# Patient Record
Sex: Female | Born: 1980 | Race: White | Hispanic: No | Smoking: Current every day smoker
Health system: Southern US, Community
[De-identification: ages and names within clinical notes are randomized; demographics above are authoritative.]

## PROBLEM LIST (undated history)

## (undated) DIAGNOSIS — F909 Attention-deficit hyperactivity disorder, unspecified type: Secondary | ICD-10-CM

---

## 2001-03-20 ENCOUNTER — Other Ambulatory Visit: Admission: RE | Admit: 2001-03-20 | Discharge: 2001-03-20 | Payer: Self-pay

## 2004-07-31 ENCOUNTER — Emergency Department (HOSPITAL_COMMUNITY): Admission: EM | Admit: 2004-07-31 | Discharge: 2004-07-31 | Payer: Self-pay | Admitting: Emergency Medicine

## 2005-08-25 ENCOUNTER — Emergency Department (HOSPITAL_COMMUNITY): Admission: EM | Admit: 2005-08-25 | Discharge: 2005-08-25 | Payer: Self-pay | Admitting: Emergency Medicine

## 2005-10-18 ENCOUNTER — Emergency Department (HOSPITAL_COMMUNITY): Admission: EM | Admit: 2005-10-18 | Discharge: 2005-10-18 | Payer: Self-pay | Admitting: Emergency Medicine

## 2005-10-22 ENCOUNTER — Emergency Department (HOSPITAL_COMMUNITY): Admission: EM | Admit: 2005-10-22 | Discharge: 2005-10-22 | Payer: Self-pay | Admitting: Emergency Medicine

## 2005-12-08 ENCOUNTER — Emergency Department (HOSPITAL_COMMUNITY): Admission: EM | Admit: 2005-12-08 | Discharge: 2005-12-08 | Payer: Self-pay | Admitting: Emergency Medicine

## 2006-03-19 ENCOUNTER — Emergency Department (HOSPITAL_COMMUNITY): Admission: EM | Admit: 2006-03-19 | Discharge: 2006-03-19 | Payer: Self-pay | Admitting: Emergency Medicine

## 2006-05-23 ENCOUNTER — Emergency Department (HOSPITAL_COMMUNITY): Admission: EM | Admit: 2006-05-23 | Discharge: 2006-05-23 | Payer: Self-pay | Admitting: Emergency Medicine

## 2006-05-27 ENCOUNTER — Emergency Department (HOSPITAL_COMMUNITY): Admission: EM | Admit: 2006-05-27 | Discharge: 2006-05-27 | Payer: Self-pay | Admitting: Emergency Medicine

## 2006-09-14 ENCOUNTER — Emergency Department (HOSPITAL_COMMUNITY): Admission: EM | Admit: 2006-09-14 | Discharge: 2006-09-14 | Payer: Self-pay | Admitting: Emergency Medicine

## 2006-10-24 ENCOUNTER — Emergency Department (HOSPITAL_COMMUNITY): Admission: EM | Admit: 2006-10-24 | Discharge: 2006-10-24 | Payer: Self-pay | Admitting: Emergency Medicine

## 2006-11-14 ENCOUNTER — Emergency Department (HOSPITAL_COMMUNITY): Admission: EM | Admit: 2006-11-14 | Discharge: 2006-11-14 | Payer: Self-pay | Admitting: *Deleted

## 2006-12-21 ENCOUNTER — Emergency Department (HOSPITAL_COMMUNITY): Admission: EM | Admit: 2006-12-21 | Discharge: 2006-12-21 | Payer: Self-pay | Admitting: Emergency Medicine

## 2006-12-29 ENCOUNTER — Emergency Department (HOSPITAL_COMMUNITY): Admission: EM | Admit: 2006-12-29 | Discharge: 2006-12-30 | Payer: Self-pay | Admitting: Emergency Medicine

## 2007-03-22 ENCOUNTER — Emergency Department (HOSPITAL_COMMUNITY): Admission: EM | Admit: 2007-03-22 | Discharge: 2007-03-22 | Payer: Self-pay | Admitting: Emergency Medicine

## 2007-06-04 ENCOUNTER — Emergency Department (HOSPITAL_COMMUNITY): Admission: EM | Admit: 2007-06-04 | Discharge: 2007-06-04 | Payer: Self-pay | Admitting: Emergency Medicine

## 2007-06-04 ENCOUNTER — Emergency Department (HOSPITAL_COMMUNITY): Admission: EM | Admit: 2007-06-04 | Discharge: 2007-06-05 | Payer: Self-pay | Admitting: Emergency Medicine

## 2007-06-05 ENCOUNTER — Ambulatory Visit (HOSPITAL_COMMUNITY): Admission: RE | Admit: 2007-06-05 | Discharge: 2007-06-05 | Payer: Self-pay | Admitting: Emergency Medicine

## 2007-08-05 ENCOUNTER — Emergency Department (HOSPITAL_COMMUNITY): Admission: EM | Admit: 2007-08-05 | Discharge: 2007-08-05 | Payer: Self-pay | Admitting: Emergency Medicine

## 2007-08-14 ENCOUNTER — Emergency Department (HOSPITAL_COMMUNITY): Admission: EM | Admit: 2007-08-14 | Discharge: 2007-08-14 | Payer: Self-pay | Admitting: Emergency Medicine

## 2007-09-05 ENCOUNTER — Emergency Department (HOSPITAL_COMMUNITY): Admission: EM | Admit: 2007-09-05 | Discharge: 2007-09-05 | Payer: Self-pay | Admitting: Emergency Medicine

## 2008-01-23 ENCOUNTER — Emergency Department (HOSPITAL_COMMUNITY): Admission: EM | Admit: 2008-01-23 | Discharge: 2008-01-23 | Payer: Self-pay | Admitting: Emergency Medicine

## 2008-02-14 ENCOUNTER — Emergency Department (HOSPITAL_COMMUNITY): Admission: EM | Admit: 2008-02-14 | Discharge: 2008-02-14 | Payer: Self-pay | Admitting: Emergency Medicine

## 2008-03-11 ENCOUNTER — Emergency Department (HOSPITAL_COMMUNITY): Admission: EM | Admit: 2008-03-11 | Discharge: 2008-03-11 | Payer: Self-pay | Admitting: Emergency Medicine

## 2008-04-01 ENCOUNTER — Emergency Department (HOSPITAL_COMMUNITY): Admission: EM | Admit: 2008-04-01 | Discharge: 2008-04-01 | Payer: Self-pay | Admitting: Emergency Medicine

## 2008-04-05 ENCOUNTER — Emergency Department (HOSPITAL_COMMUNITY): Admission: EM | Admit: 2008-04-05 | Discharge: 2008-04-05 | Payer: Self-pay | Admitting: Emergency Medicine

## 2008-05-06 ENCOUNTER — Emergency Department (HOSPITAL_COMMUNITY): Admission: EM | Admit: 2008-05-06 | Discharge: 2008-05-06 | Payer: Self-pay | Admitting: Emergency Medicine

## 2010-12-31 LAB — BASIC METABOLIC PANEL
Calcium: 9.7
Chloride: 105
Creatinine, Ser: 0.91
GFR calc Af Amer: >60

## 2010-12-31 LAB — URINE MICROSCOPIC-ADD ON

## 2010-12-31 LAB — DIFFERENTIAL
Basophils Absolute: 0
Basophils Relative: 0
Monocytes Absolute: 0.6
Monocytes Relative: 6
Neutro Abs: 5.9
Neutrophils Relative %: 58

## 2010-12-31 LAB — CBC
RBC: 4.37
WBC: 10.1

## 2010-12-31 LAB — GC/CHLAMYDIA PROBE AMP, GENITAL
Chlamydia, DNA Probe: NEGATIVE
GC Probe Amp, Genital: NEGATIVE

## 2010-12-31 LAB — URINALYSIS, ROUTINE W REFLEX MICROSCOPIC
Glucose, UA: NEGATIVE
Ketones, ur: 15 — AB
Leukocytes, UA: NEGATIVE

## 2010-12-31 LAB — HEPATIC FUNCTION PANEL
AST: 22
Albumin: 4
Bilirubin, Direct: 0.1

## 2010-12-31 LAB — WET PREP, GENITAL

## 2010-12-31 LAB — RPR: RPR Ser Ql: NONREACTIVE

## 2011-01-14 LAB — STREP A DNA PROBE: Group A Strep Probe: NEGATIVE

## 2011-01-14 LAB — RAPID STREP SCREEN (MED CTR MEBANE ONLY): Streptococcus, Group A Screen (Direct): NEGATIVE

## 2011-01-17 LAB — URINALYSIS, ROUTINE W REFLEX MICROSCOPIC
Nitrite: NEGATIVE
Protein, ur: NEGATIVE
Urobilinogen, UA: 1
pH: 5.5

## 2011-01-17 LAB — PREGNANCY, URINE: Preg Test, Ur: NEGATIVE

## 2011-01-21 LAB — CULTURE, ROUTINE-ABSCESS

## 2012-11-26 DIAGNOSIS — R079 Chest pain, unspecified: Secondary | ICD-10-CM

## 2013-02-08 ENCOUNTER — Encounter (HOSPITAL_COMMUNITY): Payer: Self-pay | Admitting: Emergency Medicine

## 2013-02-08 ENCOUNTER — Emergency Department (HOSPITAL_COMMUNITY): Payer: Medicaid Other

## 2013-02-08 ENCOUNTER — Emergency Department (HOSPITAL_COMMUNITY)
Admission: EM | Admit: 2013-02-08 | Discharge: 2013-02-08 | Disposition: A | Discharge status: Home / Self Care | Payer: Medicaid Other | Attending: Emergency Medicine | Admitting: Emergency Medicine

## 2013-02-08 DIAGNOSIS — M25561 Pain in right knee: Secondary | ICD-10-CM

## 2013-02-08 DIAGNOSIS — M25569 Pain in unspecified knee: Secondary | ICD-10-CM | POA: Insufficient documentation

## 2013-02-08 DIAGNOSIS — F17200 Nicotine dependence, unspecified, uncomplicated: Secondary | ICD-10-CM | POA: Insufficient documentation

## 2013-02-08 MED ORDER — HYDROCODONE-ACETAMINOPHEN 5-325 MG PO TABS
1.0000 | ORAL_TABLET | Freq: Once | ORAL | Status: AC
  Administered 2013-02-08: 1 via ORAL
  Filled 2013-02-08: qty 1

## 2013-02-08 MED ORDER — MELOXICAM 7.5 MG PO TABS: 7.5000 mg | ORAL_TABLET | Freq: Every day | ORAL | Status: DC

## 2013-02-08 NOTE — ED Provider Notes (Signed)
CSN: [id number]     Arrival date & time 02/08/13  1153 History   First MD Initiated Contact with Patient 02/08/13 1201     Chief Complaint  Patient presents with  . Knee Pain   (Consider location/radiation/quality/duration/timing/severity/associated sxs/prior Treatment) Patient is a 32 y.o. female presenting with knee pain. The history is provided by the patient.  Knee Pain Location:  Knee Time since incident:  5 days Injury: no   Knee location:  R knee Pain details:    Quality:  Shooting and throbbing   Radiates to:  Does not radiate   Severity:  Moderate Associated symptoms: no fever    Jennifer Ewing is a 32 y.o. female who presents to the ED with right knee pain. She states that 5 days ago she had pain in her right foot but it went away. Last night she started having pain in the right knee and it has continued. She has not taken anything for pain. She is wearing a knee brace. She denies any other problems today.   History reviewed. No pertinent past medical history. History reviewed. No pertinent past surgical history. History reviewed. No pertinent family history. History  Substance Use Topics  . Smoking status: Current Every Day Smoker -- 0.50 packs/day  . Smokeless tobacco: Not on file  . Alcohol Use: No   OB History   Grav Para Term Preterm Abortions TAB SAB Ect Mult Living                 Review of Systems  Constitutional: Negative for fever and chills.  HENT: Negative.   Eyes: Negative for visual disturbance.  Respiratory: Negative for shortness of breath.   Cardiovascular: Negative for chest pain.  Gastrointestinal: Negative for nausea, vomiting and abdominal pain.  Musculoskeletal:       Right knee pain   Skin: Negative for wound.  Allergic/Immunologic: Negative for immunocompromised state.  Neurological: Negative for headaches.  Psychiatric/Behavioral: Negative for confusion. The patient is not nervous/anxious.     Allergies  Review of patient's  allergies indicates no known allergies.  Home Medications  No current outpatient prescriptions on file. BP 121/73  Pulse 55  Temp(Src) 97.7 F (36.5 C) (Oral)  Ht 5\' 1"  (1.549 m)  Wt 135 lb (61.236 kg)  BMI 25.52 kg/m2  SpO2 99%  LMP 01/25/2013 Physical Exam  Nursing note and vitals reviewed. Constitutional: She is oriented to person, place, and time. She appears well-developed and well-nourished.  Eyes: Conjunctivae and EOM are normal.  Neck: Neck supple.  Cardiovascular: Normal rate.   Pulmonary/Chest: Effort normal.  Musculoskeletal:       Right knee: She exhibits normal range of motion, no ecchymosis, no deformity, no laceration, no erythema and no LCL laxity. Swelling: minimal. Tenderness found. MCL tenderness noted.  Neurological: She is alert and oriented to person, place, and time. No cranial nerve deficit.  Skin: Skin is warm and dry.  Psychiatric: She has a normal mood and affect. Her behavior is normal.    ED Course  Procedures Dg Knee Complete 4 Views Right  02/08/2013   CLINICAL DATA:  Right knee pain, no known injury  EXAM: RIGHT KNEE - COMPLETE 4+ VIEW  COMPARISON:  None.  FINDINGS: No fracture or dislocation is seen.  The joint spaces are preserved.  The visualized soft tissues are unremarkable.  No definite suprapatellar knee joint effusion.  IMPRESSION: No fracture or dislocation is seen.   Electronically Signed   By: Roselie Awkward.D.  On: 02/08/2013 12:51    MDM  32 y.o. female with right knee pain. Placed in knee immobilizer, crutches with instructions, ice and elevate. Patient to follow up with ortho. Patient stable for discharge without any immediate complications. She remains neurovascularly intact.  Discussed with the patient clinical and lab findings and all questioned fully answered.   Medication List         meloxicam 7.5 MG tablet  Commonly known as:  MOBIC  Take 1 tablet (7.5 mg total) by mouth daily.            Janne Napoleon,  [place] 02/08/13 [phone]

## 2013-02-08 NOTE — ED Notes (Signed)
Pain to right foot 5 days ago and now pain is in right knee .  No injury

## 2013-02-09 NOTE — ED Provider Notes (Signed)
History/physical exam/procedure(s) were performed by non-physician practitioner and as supervising physician I was immediately available for consultation/collaboration. I have reviewed all notes and am in agreement with care and plan.   Hilario Quarry, MD 02/09/13 [phone]

## 2014-01-20 ENCOUNTER — Emergency Department (HOSPITAL_COMMUNITY): Payer: Medicaid Other

## 2014-01-20 ENCOUNTER — Emergency Department (HOSPITAL_COMMUNITY)
Admission: EM | Admit: 2014-01-20 | Discharge: 2014-01-20 | Disposition: A | Discharge status: Home / Self Care | Payer: Medicaid Other | Attending: Emergency Medicine | Admitting: Emergency Medicine

## 2014-01-20 ENCOUNTER — Encounter (HOSPITAL_COMMUNITY): Payer: Self-pay | Admitting: Emergency Medicine

## 2014-01-20 DIAGNOSIS — Z79899 Other long term (current) drug therapy: Secondary | ICD-10-CM | POA: Diagnosis not present

## 2014-01-20 DIAGNOSIS — M79642 Pain in left hand: Secondary | ICD-10-CM

## 2014-01-20 DIAGNOSIS — Z791 Long term (current) use of non-steroidal anti-inflammatories (NSAID): Secondary | ICD-10-CM | POA: Insufficient documentation

## 2014-01-20 DIAGNOSIS — M25512 Pain in left shoulder: Secondary | ICD-10-CM

## 2014-01-20 DIAGNOSIS — Y9289 Other specified places as the place of occurrence of the external cause: Secondary | ICD-10-CM | POA: Insufficient documentation

## 2014-01-20 DIAGNOSIS — W11XXXA Fall on and from ladder, initial encounter: Secondary | ICD-10-CM | POA: Diagnosis not present

## 2014-01-20 DIAGNOSIS — Y9389 Activity, other specified: Secondary | ICD-10-CM | POA: Insufficient documentation

## 2014-01-20 DIAGNOSIS — Z720 Tobacco use: Secondary | ICD-10-CM | POA: Diagnosis not present

## 2014-01-20 DIAGNOSIS — S4992XA Unspecified injury of left shoulder and upper arm, initial encounter: Secondary | ICD-10-CM | POA: Insufficient documentation

## 2014-01-20 MED ORDER — HYDROCODONE-ACETAMINOPHEN 5-325 MG PO TABS: 1.0000 | ORAL_TABLET | ORAL | Status: DC | PRN

## 2014-01-20 MED ORDER — IBUPROFEN 600 MG PO TABS: 600.0000 mg | ORAL_TABLET | Freq: Four times a day (QID) | ORAL | Status: DC | PRN

## 2014-01-20 MED ORDER — HYDROCODONE-ACETAMINOPHEN 5-325 MG PO TABS
1.0000 | ORAL_TABLET | Freq: Once | ORAL | Status: AC
  Administered 2014-01-20: 1 via ORAL
  Filled 2014-01-20: qty 1

## 2014-01-20 NOTE — ED Notes (Signed)
Patient states she fell 6 ft off a ladder yesterday morning and is complaining of left arm pain from shoulder to hand.

## 2014-01-20 NOTE — Discharge Instructions (Signed)
Shoulder Pain The shoulder is the joint that connects your arms to your body. The bones that form the shoulder joint include the upper arm bone (humerus), the shoulder blade (scapula), and the collarbone (clavicle). The top of the humerus is shaped like a ball and fits into a rather flat socket on the scapula (glenoid cavity). A combination of muscles and strong, fibrous tissues that connect muscles to bones (tendons) support your shoulder joint and hold the ball in the socket. Small, fluid-filled sacs (bursae) are located in different areas of the joint. They act as cushions between the bones and the overlying soft tissues and help reduce friction between the gliding tendons and the bone as you move your arm. Your shoulder joint allows a wide range of motion in your arm. This range of motion allows you to do things like scratch your back or throw a ball. However, this range of motion also makes your shoulder more prone to pain from overuse and injury. Causes of shoulder pain can originate from both injury and overuse and usually can be grouped in the following four categories:  Redness, swelling, and pain (inflammation) of the tendon (tendinitis) or the bursae (bursitis).  Instability, such as a dislocation of the joint.  Inflammation of the joint (arthritis).  Broken bone (fracture). HOME CARE INSTRUCTIONS   Apply ice to the sore area.  Put ice in a plastic bag.  Place a towel between your skin and the bag.  Leave the ice on for 15-20 minutes, 3-4 times per day for the first 2 days, or as directed by your health care provider.  Stop using cold packs if they do not help with the pain.  If you have a shoulder sling or immobilizer, wear it as long as your caregiver instructs. Only remove it to shower or bathe. Move your arm as little as possible, but keep your hand moving to prevent swelling.  Squeeze a soft ball or foam pad as much as possible to help prevent swelling.  Only take  over-the-counter or prescription medicines for pain, discomfort, or fever as directed by your caregiver. SEEK MEDICAL CARE IF:   Your shoulder pain increases, or new pain develops in your arm, hand, or fingers.  Your hand or fingers become cold and numb.  Your pain is not relieved with medicines. SEEK IMMEDIATE MEDICAL CARE IF:   Your arm, hand, or fingers are numb or tingling.  Your arm, hand, or fingers are significantly swollen or turn white or blue. MAKE SURE YOU:   Understand these instructions.  Will watch your condition.  Will get help right away if you are not doing well or get worse. Document Released: 01/05/2005 Document Revised: 08/12/2013 Document Reviewed: 03/12/2011 [facility]Exit[facility] Patient Information 2015 [place], [place]. This information is not intended to replace advice given to you by your health care provider. Make sure you discuss any questions you have with your health care provider.   Your x-rays are negative today for any bony injury.  Use the sling for comfort, continue using ice packs to your shoulder and wrist.  He may use the medication prescribed for pain relief, use caution with hydrocodone as this will make you drowsy.  Do not drive within 4 hours of taking this medication.  Please call Dr. Hilda LiasKeeling for further evaluation of your injuries if your pain persists beyond the next week.  You may need further tests to rule out any soft tissue injury such as ligament or tendon strain.

## 2014-01-20 NOTE — ED Provider Notes (Signed)
CSN: [id number]     Arrival date & time 01/20/14  1042 History  This chart was scribed for non-physician practitioner Burgess AmorJulie Hoang Pettingill, PA-C working with Glynn OctaveStephen Rancour, MD by Littie Deedsichard Sun, ED Scribe. This patient was seen in room APFT20/APFT20 and the patient's care was started at 12:11 PM.    Chief Complaint  Patient presents with  . Arm Pain      The history is provided by the patient. No language interpreter was used.   HPI Comments: Jennifer BoyerJamie M Ewing is a 33 y.o. female who presents to the Emergency Department complaining of sudden onset, constant left shoulder and left hand pain after she fell off of a ladder on her left shoulder onto mulch around 7:00 am yesterday. Patient notes feeling a grinding feeling in her left shoulder with movement. Patient has tried taking Tylenol for her pain but with no relief to her pain. Her pain is constant, aching and worsened with movement and palpation.  She denies weakness or numbness.  She has an appointment with her PCP in 3 days for a normal checkup. Patient has been to an orthopedist for a hand injury in the past, but she does not remember who it was. She has allergies to codeine, but she has taken hydrocodone before and has done fine with it.   History reviewed. No pertinent past medical history. History reviewed. No pertinent past surgical history. History reviewed. No pertinent family history. History  Substance Use Topics  . Smoking status: Current Every Day Smoker -- 0.50 packs/day  . Smokeless tobacco: Not on file  . Alcohol Use: No   OB History   Grav Para Term Preterm Abortions TAB SAB Ect Mult Living                 Review of Systems  Constitutional: Negative for fever.  Musculoskeletal: Positive for arthralgias. Negative for joint swelling and myalgias.  Neurological: Negative for weakness and numbness.      Allergies  Codeine  Home Medications   Prior to Admission medications   Medication Sig Start Date End Date  Taking? Authorizing Provider  ALPRAZolam Prudy Feeler(XANAX) 0.5 MG tablet Take 0.5 mg by mouth 3 (three) times daily.   Yes Historical Provider, MD  Brexpiprazole (REXULTI) 3 MG TABS Take 1 tablet by mouth at bedtime.   Yes Historical Provider, MD  ibuprofen (ADVIL,MOTRIN) 200 MG tablet Take 200-400 mg by mouth every 6 (six) hours as needed for moderate pain.   Yes Historical Provider, MD  lamoTRIgine (LAMICTAL) 25 MG tablet Take 25 mg by mouth 2 (two) times daily.   Yes Historical Provider, MD  naproxen sodium (ANAPROX) 220 MG tablet Take 440 mg by mouth 2 (two) times daily as needed (pain).   Yes Historical Provider, MD  HYDROcodone-acetaminophen (NORCO/VICODIN) 5-325 MG per tablet Take 1 tablet by mouth every 4 (four) hours as needed. 01/20/14   [name], PA-C  ibuprofen (ADVIL,MOTRIN) 600 MG tablet Take 1 tablet (600 mg total) by mouth every 6 (six) hours as needed. 01/20/14   [name], PA-C   BP 112/72  Pulse 89  Temp(Src) 98.6 F (37 C) (Oral)  Resp 14  Ht 5\' 2"  (1.575 m)  Wt 130 lb (58.968 kg)  BMI 23.77 kg/m2  SpO2 99%  LMP 12/28/2013 Physical Exam  Nursing note and vitals reviewed. Constitutional: She appears well-developed and well-nourished.  HENT:  Head: Atraumatic.  Neck: Normal range of motion.  Cardiovascular:  Pulses equal bilaterally  Musculoskeletal: She exhibits tenderness.  TTP  left lateral humeral head, there is no visible trauma including no bruising or hematoma. She has increased pain with abduction. Non-tender remainder of her arm and wrist, but she is point tender over her 2nd MCP joint. No edema or bruising. She can wiggle her fingers and she has less than 2 seconds cap refill. Radial pulses are intact. No palpable or visible deformity.  Neurological: She is alert. She has normal strength. She displays normal reflexes. No sensory deficit.  Skin: Skin is warm and dry.  Psychiatric: She has a normal mood and affect.    ED Course  Procedures DIAGNOSTIC  STUDIES: Oxygen Saturation is 99% on RA, nml by my interpretation.    COORDINATION OF CARE: 12:17 PM-Discussed treatment plan which includes medication and possible referral to an orthopedic specialist with pt at bedside and pt agreed to plan.   Labs Review Labs Reviewed - No data to display  Imaging Review Dg Shoulder Left  01/20/2014   CLINICAL DATA:  LEFT shoulder and hand pain for 1 day, fell off a ladder.  EXAM: LEFT SHOULDER - 2+ VIEW  COMPARISON:  None.  FINDINGS: There is no evidence of fracture or dislocation. There is no evidence of arthropathy or other focal bone abnormality. Soft tissues are unremarkable.  IMPRESSION: Negative.   Electronically Signed   By: Davonna BellingJohn  Curnes M.D.   On: 01/20/2014 12:00   Dg Hand Complete Left  01/20/2014   CLINICAL DATA:  Pain in the arm and hand. Duration column 1 day. Fall from a ladder.  EXAM: LEFT HAND - COMPLETE 3+ VIEW  COMPARISON:  None.  FINDINGS: There is no evidence of fracture or dislocation. There is no evidence of arthropathy or other focal bone abnormality. Soft tissues are unremarkable.  IMPRESSION: Negative.   Electronically Signed   By: Herbie BaltimoreWalt  Liebkemann M.D.   On: 01/20/2014 11:59     EKG Interpretation None      MDM   Final diagnoses:  Shoulder pain, acute, left  Hand pain, left    Patients labs and/or radiological studies were viewed and considered during the medical decision making and disposition process. xrays negative, exam without obvious findings except for pain.  She was placed in a sling for comfort. Advised ice,  Rest, prescribed hydrocodone and ibuprofen for pain and inflammation.  Referral to Dr.Keeling for further evaluation if sx persist or are not improving over the next week  I personally performed the services described in this documentation, which was scribed in my presence. The recorded information has been reviewed and is accurate.   Burgess AmorJulie [name], PA-C 01/20/14 2056

## 2014-01-21 NOTE — ED Provider Notes (Signed)
Medical screening examination/treatment/procedure(s) were performed by non-physician practitioner and as supervising physician I was immediately available for consultation/collaboration.   EKG Interpretation None        Dequante Tremaine, MD 01/21/14 0710 

## 2014-02-24 ENCOUNTER — Encounter (HOSPITAL_COMMUNITY): Payer: Self-pay | Admitting: *Deleted

## 2014-02-24 ENCOUNTER — Emergency Department (HOSPITAL_COMMUNITY)
Admission: EM | Admit: 2014-02-24 | Discharge: 2014-02-24 | Disposition: A | Discharge status: Home / Self Care | Payer: Medicaid Other | Attending: Emergency Medicine | Admitting: Emergency Medicine

## 2014-02-24 DIAGNOSIS — Y9389 Activity, other specified: Secondary | ICD-10-CM | POA: Diagnosis not present

## 2014-02-24 DIAGNOSIS — X58XXXA Exposure to other specified factors, initial encounter: Secondary | ICD-10-CM | POA: Diagnosis not present

## 2014-02-24 DIAGNOSIS — S29002A Unspecified injury of muscle and tendon of back wall of thorax, initial encounter: Secondary | ICD-10-CM | POA: Diagnosis not present

## 2014-02-24 DIAGNOSIS — S4991XA Unspecified injury of right shoulder and upper arm, initial encounter: Secondary | ICD-10-CM | POA: Diagnosis present

## 2014-02-24 DIAGNOSIS — Y9289 Other specified places as the place of occurrence of the external cause: Secondary | ICD-10-CM | POA: Diagnosis not present

## 2014-02-24 DIAGNOSIS — Z79899 Other long term (current) drug therapy: Secondary | ICD-10-CM | POA: Diagnosis not present

## 2014-02-24 DIAGNOSIS — Y998 Other external cause status: Secondary | ICD-10-CM | POA: Insufficient documentation

## 2014-02-24 DIAGNOSIS — Z720 Tobacco use: Secondary | ICD-10-CM | POA: Diagnosis not present

## 2014-02-24 DIAGNOSIS — S46911A Strain of unspecified muscle, fascia and tendon at shoulder and upper arm level, right arm, initial encounter: Secondary | ICD-10-CM | POA: Insufficient documentation

## 2014-02-24 DIAGNOSIS — T148XXA Other injury of unspecified body region, initial encounter: Secondary | ICD-10-CM

## 2014-02-24 MED ORDER — TRAMADOL HCL 50 MG PO TABS: 50.0000 mg | ORAL_TABLET | Freq: Four times a day (QID) | ORAL | Status: DC | PRN

## 2014-02-24 MED ORDER — CYCLOBENZAPRINE HCL 5 MG PO TABS: 5.0000 mg | ORAL_TABLET | Freq: Three times a day (TID) | ORAL | Status: DC | PRN

## 2014-02-24 NOTE — Discharge Instructions (Signed)
Repetitive Strain Injuries  Repetitive strain injuries (RSIs) result from overuse or misuse of soft tissues including muscles, tendons, or nerves. Tendons are the cord-like structures that attach muscles to bones. RSIs can affect almost any part of the body. However, RSIs are most common in the arms (thumbs, wrists, elbows, shoulders) and legs (ankles, knees). Common medical conditions that are often caused by repetitive strain include carpal tunnel syndrome, tennis or golfer's elbow, bursitis, and tendonitis. If RSIs are treated early, and therepeated activity is reduced or removed, the severity and length of your problems can usually be reduced. RSIs are also called cumulative trauma disorders (CTD).   CAUSES   Many RSIs occur due to repeating the same activity at work over weeks or months without sufficient rest, such as prolonged typing. RSIs also commonly occur when a hobby or sport is done repeatedly without sufficient rest. RSIs can also occur due to repeated strain or stress on a body part in someone who has one or more risk factors for RSIs.  RISK FACTORS  Workplace risk factors   Frequent computer use, especially if your workstation is not adjusted for your body type.   Infrequent rest breaks.   Working in a high-pressure environment.   Working at a fast pace.   Repeating the same motion, such as frequent typing.   Working in an awkward position or holding the same position for a long time.   Forceful movements such as lifting, pulling, or pushing.   Vibration caused by using power tools.   Working in cold temperatures.   Job stress.  Personal risk factors   Poor posture.   Being loose-jointed.   Not exercising regularly.   Being overweight.   Arthritis, diabetes, thyroid problems, or other long-term (chronic)medical conditions.   Vitamin deficiencies.   Keeping your fingernails long.   An unhealthy, stressful, or inactive lifestyle.   Not sleeping well.  SYMPTOMS   Symptoms often  begin at work but become more noticeable after the repeated stress has ended. For example, you may develop fatigue or soreness in your wrist while typingat work, and at night you may develop numbness and tingling in your fingers. Common symptoms include:    Burning, shooting, or aching pain, especially in the fingers, palms, wrists, forearms, or shoulders.   Tenderness.   Swelling.   Tingling, numbness, or loss of feeling.   Pain with certain activities, such as turning a doorknob or reaching above your head.   Weakness, heaviness, or loss of coordination in yourhand.   Muscle spasms or tightness.  In some cases, symptoms can become so intense that it is difficult to perform everyday tasks. Symptoms that do not improve with rest may indicate a more serious condition.   DIAGNOSIS   Your caregiver may determine the type ofRSI you have based on your medical evaluation and a description of your activities.   TREATMENT   Treatment depends on the severity and type of RSI you have. Your caregiver may recommend rest for the affected body part, medicines, and physical or occupational therapy to reduce pain, swelling, and soreness. Discuss the activities you do repeatedly with your caregiver. Your caregiver can help you decide whether you need to change your activities. An RSI may take months or years to heal, especially if the affected body part gets insufficient rest. In some cases, such as severe carpal tunnel syndrome, surgery may be recommended.  PREVENTION   Talk with your supervisor to make sure you have the proper equipment   for your work station.   Maintain good posture at your desk or work station with:   Feet flat on the floor.   Knees directly over the feet, bent at a right angle.   Lower back supported by your chair or a cushion in the curve of your lower back.   Shoulders and arms relaxed and at your sides.   Neck relaxed and not bent forwards or backwards.   Your desk and computer workstation  properly adjusted to your body type.   Your chair adjusted so there is no excess pressure on the back of your thighs.   The keyboard resting above your thighs. You should be able to reach the keys with your elbows at your side, bent at a right angle. Your arms should be supported on forearm rests, with your forearms parallel to the ground.   The computer mouse within easy reach.   The monitor directly in front of you, so that your eyes are aligned with the top of the screen. The screen should be about 15 to 25 inches from your eyes.   While typing, keep your wrist straight, in a neutral position. Move your entire arm when you move your mouse or when typing hard-to-reach keys.   Only use your computer as much as you need to for work. Do not use it during breaks.   Take breaks often from any repeated activity. Alternate with another task which requires you to use different muscles, or rest at least once every hour.   Change positions regularly. If you spend a lot of time sitting, get up, walk around, and stretch.   Do not hold pens or pencils tightly when writing.   Exercise regularly.   Maintain a normal weight.   Eat a diet with plenty of vegetables, whole grains, and fruit.   Get sufficient, restful sleep.  HOME CARE INSTRUCTIONS   If your caregiver prescribed medicine to help reduce swelling, take it as directed.   Only take over-the-counter or prescription medicines for pain, discomfort, or fever as directed by your caregiver.   Reduce, and if needed, stopthe activities that are causing your problems until you have no further symptoms.If your symptoms are work-related, you may need to talk to your supervisor about changing your activities.   When symptoms develop, put ice or a cold pack on the aching area.   Put ice in a plastic bag.   Place a towel between your skin and the bag.   Leave the ice on for 15-20 minutes.   If you were given a splint to keep your wrist from bending, wear it as  instructed. It is important to wear the splint at night. Use the splint for as long as your caregiver recommends.  SEEK MEDICAL CARE IF:   You develop new problems.   Your problems do not get better with medicine.  MAKE SURE YOU:   Understand these instructions.   Will watch your condition.   Will get help right away if you are not doing well or get worse.  Document Released: 03/18/2002 Document Revised: 09/27/2011 Document Reviewed: 05/19/2011  Exit[employer] Patient Information 2015 [employer]. This information is not intended to replace advice given to you by your health care provider. Make sure you discuss any questions you have with your health care provider.

## 2014-02-24 NOTE — ED Provider Notes (Signed)
CSN: [id number]     Arrival date & time 02/24/14  1122 History  This chart was scribed for non-physician practitioner, Burgess AmorJulie Divine Hansley, PA-C,working with Geoffery Lyonsouglas Delo, MD, by Karle PlumberJennifer Tensley, ED Scribe. This patient was seen in room APFT21/APFT21 and the patient's care was started at 12:15 PM.  Chief Complaint  Patient presents with  . Shoulder Pain   Patient is a 33 y.o. female presenting with shoulder pain. The history is provided by the patient. No language interpreter was used.  Shoulder Pain Associated symptoms: no fever     HPI Comments:  Jennifer Ewing is a 33 y.o. 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 She states working and moving her neck makes her pain worse. Elevation of the shoulder also makes the pain worse. She states she has been taking Ibuprofen 600 mg with no relief of the pain. She has also applied a heating pad and taken Vicodin with significant relief of the pain. She has been using an OTC muscle rub as well. She states she was seen by her PCP and was told to get a referral from one of the hospitals for orthopedics. Denies numbness, weakness, or tingling of the right arm or hand, fever or chills. Pt is right-hand dominant. Denies trauma, injury or fall. Pt is currently menstruating.  PCP is Dr. Bradly BienenstockHassani  History reviewed. No pertinent past medical history. History reviewed. No pertinent past surgical history. No family history on file. History  Substance Use Topics  . Smoking status: Current Every Day Smoker -- 0.50 packs/day  . Smokeless tobacco: Not on file  . Alcohol Use: No   OB History    No data available     Review of Systems  Constitutional: Negative for fever.  Musculoskeletal: Positive for myalgias.  Skin: Negative for wound.  Neurological: Negative for weakness and numbness.    Allergies   Codeine  Home Medications   Prior to Admission medications   Medication Sig Start Date End Date Taking? Authorizing Provider  ALPRAZolam Prudy Feeler(XANAX) 0.5 MG tablet Take 0.5 mg by mouth 3 (three) times daily.    Historical Provider, MD  Brexpiprazole (REXULTI) 3 MG TABS Take 1 tablet by mouth at bedtime.    Historical Provider, MD  cyclobenzaprine (FLEXERIL) 5 MG tablet Take 1 tablet (5 mg total) by mouth 3 (three) times daily as needed for muscle spasms. 02/24/14   Burgess AmorJulie Alliyah Roesler, PA-C  HYDROcodone-acetaminophen (NORCO/VICODIN) 5-325 MG per tablet Take 1 tablet by mouth every 4 (four) hours as needed. 01/20/14   Burgess AmorJulie Lonnell Chaput, PA-C  ibuprofen (ADVIL,MOTRIN) 200 MG tablet Take 200-400 mg by mouth every 6 (six) hours as needed for moderate pain.    Historical Provider, MD  ibuprofen (ADVIL,MOTRIN) 600 MG tablet Take 1 tablet (600 mg total) by mouth every 6 (six) hours as needed. 01/20/14   Burgess AmorJulie Thereasa Iannello, PA-C  lamoTRIgine (LAMICTAL) 25 MG tablet Take 25 mg by mouth 2 (two) times daily.    Historical Provider, MD  naproxen sodium (ANAPROX) 220 MG tablet Take 440 mg by mouth 2 (two) times daily as needed (pain).    Historical Provider, MD  traMADol (ULTRAM) 50 MG tablet Take 1 tablet (50 mg total) by mouth every 6 (six) hours as needed. 02/24/14   Burgess AmorJulie Andranik Jeune, PA-C   BP 112/49 mmHg  Pulse 81  Temp(Src) 98.5 F (36.9 C) (Oral)  Resp 16  Ht 5\' 2"  (1.575 m)  Wt 130 lb (58.968 kg)  BMI 23.77 kg/m2  SpO2 94%  LMP 02/21/2014 Physical Exam  Constitutional: She appears well-developed and well-nourished.  HENT:  Head: Atraumatic.  Neck: Normal range of motion.  Cardiovascular:  Pulses equal bilaterally  Musculoskeletal: She exhibits tenderness.  Generalized tenderness across right upper shoulder and upper back consistent with distribution of trapezius muscle. Full ROM of cervical spine. No midline tenderness to palpation. Not tender to palpation over right shoulder.  Neurological: She is alert. She has  normal strength. She displays normal reflexes. No sensory deficit.  Equal grip strength.  Skin: Skin is warm and dry.  Psychiatric: She has a normal mood and affect.    ED Course  Procedures (including critical care time) DIAGNOSTIC STUDIES: Oxygen Saturation is 94% on RA, adequate by my interpretation.   COORDINATION OF CARE: 12:22 PM- Will prescribe pain medication, muscle relaxer, nausea medication and refer to orthopedics. Pt verbalizes understanding and agrees to plan.  Medications - No data to display  Labs Review Labs Reviewed - No data to display  Imaging Review No results found.   EKG Interpretation None      MDM   Final diagnoses:  Muscle strain    Pt prescribed flexeril, tramadol. Encouraged to continue with ibuprofen and heat tx. F/u with pcp and or ortho if sx are not better with this tx.    The patient appears reasonably screened and/or stabilized for discharge and I doubt any other medical condition or other [facility] requiring further screening, evaluation, or treatment in the ED at this time prior to discharge.   I personally performed the services described in this documentation, which was scribed in my presence. The recorded information has been reviewed and is accurate.    Burgess AmorJulie Lilias Lorensen, PA-C 02/26/14 2257  Geoffery Lyonsouglas Delo, MD 02/28/14 2112

## 2014-02-24 NOTE — ED Notes (Signed)
Pt states she is having right shoulder pain starting 3 weeks ago when she started at her new job.  Worse with movement. Denies injury.

## 2014-02-24 NOTE — ED Notes (Signed)
Pt alert, NAd, family with her. Pt ready for d/c when seen by me

## 2014-09-18 ENCOUNTER — Emergency Department (HOSPITAL_COMMUNITY)
Admission: EM | Admit: 2014-09-18 | Discharge: 2014-09-18 | Disposition: A | Discharge status: Home / Self Care | Payer: Medicaid Other | Attending: Emergency Medicine | Admitting: Emergency Medicine

## 2014-09-18 ENCOUNTER — Encounter (HOSPITAL_COMMUNITY): Payer: Self-pay | Admitting: Cardiology

## 2014-09-18 ENCOUNTER — Emergency Department (HOSPITAL_COMMUNITY): Payer: Medicaid Other

## 2014-09-18 DIAGNOSIS — J069 Acute upper respiratory infection, unspecified: Secondary | ICD-10-CM | POA: Diagnosis not present

## 2014-09-18 DIAGNOSIS — R062 Wheezing: Secondary | ICD-10-CM | POA: Diagnosis not present

## 2014-09-18 DIAGNOSIS — Z3202 Encounter for pregnancy test, result negative: Secondary | ICD-10-CM | POA: Diagnosis not present

## 2014-09-18 DIAGNOSIS — Z720 Tobacco use: Secondary | ICD-10-CM | POA: Diagnosis not present

## 2014-09-18 DIAGNOSIS — R112 Nausea with vomiting, unspecified: Secondary | ICD-10-CM | POA: Diagnosis present

## 2014-09-18 DIAGNOSIS — Z79899 Other long term (current) drug therapy: Secondary | ICD-10-CM | POA: Insufficient documentation

## 2014-09-18 DIAGNOSIS — R197 Diarrhea, unspecified: Secondary | ICD-10-CM | POA: Insufficient documentation

## 2014-09-18 DIAGNOSIS — R079 Chest pain, unspecified: Secondary | ICD-10-CM | POA: Insufficient documentation

## 2014-09-18 DIAGNOSIS — R059 Cough, unspecified: Secondary | ICD-10-CM

## 2014-09-18 DIAGNOSIS — R05 Cough: Secondary | ICD-10-CM

## 2014-09-18 LAB — COMPREHENSIVE METABOLIC PANEL
ALBUMIN: 4.2 g/dL (ref 3.5–5.0)
ALK PHOS: 56 U/L (ref 38–126)
ALT: 11 U/L — ABNORMAL LOW (ref 14–54)
AST: 17 U/L (ref 15–41)
Anion gap: 9 (ref 5–15)
BUN: 14 mg/dL (ref 6–20)
CALCIUM: 9.6 mg/dL (ref 8.9–10.3)
CHLORIDE: 105 mmol/L (ref 101–111)
CO2: 25 mmol/L (ref 22–32)
CREATININE: 1.17 mg/dL — AB (ref 0.44–1.00)
GFR calc Af Amer: >60 mL/min (ref >60)
GFR calc non Af Amer: >60 mL/min (ref >60)
Glucose, Bld: 88 mg/dL (ref 65–99)
Potassium: 4.1 mmol/L (ref 3.5–5.1)
Sodium: 139 mmol/L (ref 135–145)
Total Bilirubin: 0.9 mg/dL (ref 0.3–1.2)
Total Protein: 7.4 g/dL (ref 6.5–8.1)

## 2014-09-18 LAB — POC URINE PREG, ED: PREG TEST UR: NEGATIVE

## 2014-09-18 LAB — CBC WITH DIFFERENTIAL/PLATELET
Basophils Absolute: 0 10*3/uL (ref 0.0–0.1)
Basophils Relative: 0 % (ref 0–1)
EOS ABS: 0.2 10*3/uL (ref 0.0–0.7)
Eosinophils Relative: 1 % (ref 0–5)
HCT: 45.2 % (ref 36.0–46.0)
HEMOGLOBIN: 15.4 g/dL — AB (ref 12.0–15.0)
LYMPHS ABS: 2.7 10*3/uL (ref 0.7–4.0)
Lymphocytes Relative: 18 % (ref 12–46)
MCH: 30.7 pg (ref 26.0–34.0)
MCHC: 34.1 g/dL (ref 30.0–36.0)
MCV: 90.2 fL (ref 78.0–100.0)
MONO ABS: 0.5 10*3/uL (ref 0.1–1.0)
MONOS PCT: 3 % (ref 3–12)
NEUTROS PCT: 78 % — AB (ref 43–77)
Neutro Abs: 11.3 10*3/uL — ABNORMAL HIGH (ref 1.7–7.7)
Platelets: 321 10*3/uL (ref 150–400)
RBC: 5.01 MIL/uL (ref 3.87–5.11)
RDW: 13.2 % (ref 11.5–15.5)
WBC: 14.7 10*3/uL — ABNORMAL HIGH (ref 4.0–10.5)

## 2014-09-18 LAB — URINALYSIS, ROUTINE W REFLEX MICROSCOPIC
Glucose, UA: NEGATIVE mg/dL
HGB URINE DIPSTICK: NEGATIVE
KETONES UR: 15 mg/dL — AB
LEUKOCYTES UA: NEGATIVE
Nitrite: NEGATIVE
PH: 6.5 (ref 5.0–8.0)
Protein, ur: NEGATIVE mg/dL
Specific Gravity, Urine: 1.037 — ABNORMAL HIGH (ref 1.005–1.030)
UROBILINOGEN UA: 1 mg/dL (ref 0.0–1.0)

## 2014-09-18 MED ORDER — ALBUTEROL SULFATE (2.5 MG/3ML) 0.083% IN NEBU
5.0000 mg | INHALATION_SOLUTION | Freq: Once | RESPIRATORY_TRACT | Status: AC
Start: 2014-09-18 — End: 2014-09-18
  Administered 2014-09-18: 5 mg via RESPIRATORY_TRACT
  Filled 2014-09-18: qty 6

## 2014-09-18 MED ORDER — PREDNISONE 20 MG PO TABS: 60.0000 mg | ORAL_TABLET | Freq: Every day | ORAL | Status: DC

## 2014-09-18 MED ORDER — ALBUTEROL SULFATE HFA 108 (90 BASE) MCG/ACT IN AERS: 1.0000 | INHALATION_SPRAY | Freq: Four times a day (QID) | RESPIRATORY_TRACT | Status: DC | PRN

## 2014-09-18 MED ORDER — SODIUM CHLORIDE 0.9 % IV BOLUS (SEPSIS): 1000.0000 mL | Freq: Once | INTRAVENOUS | Status: DC

## 2014-09-18 MED ORDER — PREDNISONE 20 MG PO TABS
60.0000 mg | ORAL_TABLET | Freq: Once | ORAL | Status: AC
  Administered 2014-09-18: 60 mg via ORAL
  Filled 2014-09-18: qty 3

## 2014-09-18 MED ORDER — ONDANSETRON HCL 4 MG/2ML IJ SOLN
4.0000 mg | Freq: Once | INTRAMUSCULAR | Status: DC
  Filled 2014-09-18: qty 2

## 2014-09-18 MED ORDER — IPRATROPIUM BROMIDE 0.02 % IN SOLN
0.5000 mg | Freq: Once | RESPIRATORY_TRACT | Status: AC
  Administered 2014-09-18: 0.5 mg via RESPIRATORY_TRACT
  Filled 2014-09-18: qty 2.5

## 2014-09-18 MED ORDER — ONDANSETRON HCL 4 MG PO TABS: 4.0000 mg | ORAL_TABLET | Freq: Four times a day (QID) | ORAL | Status: DC

## 2014-09-18 NOTE — ED Notes (Signed)
Patient transported to X-ray 

## 2014-09-18 NOTE — ED Notes (Signed)
Pt reports n/v/d for the past 5 days. States that she has been unable to keep anything down. Reports she has tried tylenol and nyquil without relief.

## 2014-09-18 NOTE — Discharge Instructions (Signed)
Take Prednisone as prescribed.  You received Prednisone in the ED today.  Therefore, take your second dose tomorrow.  You do not need to take the Prednisone today.  Take Albuterol as prescribed as needed.    Follow up with your primary care doctor about your hospital visit. Continue to hydrate orally.Take all medications as prescribed & use Zofran as directed for nausea & vomiting.  Read the instructions below for reasons to return to the ER.   The 'BRAT' diet is suggested, then progress to diet as tolerated as symptoms abate. Call if bloody stools, persistent diarrhea, vomiting, fever or abdominal pain. Bananas.  Rice.  Applesauce.  Toast (and other simple starches such as crackers, potatoes, noodles).   SEEK IMMEDIATE MEDICAL ATTENTION IF:  You begin having localized abdominal pain that does not go away or becomes severe (The right side could  possibly be appendicitis. In an adult, the left lower portion of the abdomen could be colitis or diverticulitis)   A temperature above 101 develops  Repeated vomiting occurs (multiple uncontrollable episodes) or you are unable to keep fluids down  Blood is being passed in stools or vomit (bright red or black tarry stools).   Return also if you develop chest pain, difficulty breathing, dizziness or fainting, or become confused, poorly responsive, or inconsolable (young children).   RESOURCE GUIDE  Dental Problems  Patients with Medicaid: [facility] 225 150 [address]                                           [phone] [address] Phone:  [phone]                                                  Phone:  [phone]  If unable to pay or uninsured, contact:  [facility] or [facility]. to become qualified for the adult dental clinic.  Chronic Pain Problems Contact [facility]  [phone] Patients need to be referred by their primary care  doctor.  Insufficient Money for Medicine Contact United Way:  call "211" or [facility] [phone].  No Primary Care Doctor Call [facility]  [phone] Other agencies that provide inexpensive medical care    [facility]  [phone]    [facility]  [phone]    [facility]  [phone]    [facility]  [phone]    [facility]  [phone]    [facility]  [phone]  Psychological Services [facility]  [phone] [facility]  [phone] [facility]   [phone] (emergency services [phone])  Substance Abuse Resources [facility]  [phone] [facility] 3326551314 [facility] [place] [phone] Floydene Flock [phone] Residential & Outpatient Substance Abuse Program  [phone]  Abuse/Neglect [facility] Child Abuse Hotline (608)051-0953 [facility] Child Abuse Hotline 224-211-2559 (After Hours)  Emergency Shelter [facility] [phone]  Maternity Homes [facility] [place] [facility] [phone] [facility] [phone]  MRSA Hotline #:   260-008-4397  [facility] Resources  [facility]     United Way                          [facility] [address] [place]                       [address]      [address]  [place]                                                [place]                            [place] Phone:  [phone]                                   Phone:  [phone]                 Phone:  [phone]  [facility] Phone:  [phone]  [facility] Child Abuse Hotline 416-659-4522(336) 352-008-0398 6133552806(336) [phone] (After Hours)

## 2014-09-18 NOTE — ED Notes (Signed)
Pt hooked back up to the monitor with a 5 lead, BP cuff and pulse ox 

## 2014-09-18 NOTE — ED Notes (Signed)
Patient returned from X-ray 

## 2014-09-18 NOTE — ED Notes (Signed)
Patient refuses to be stuck again for start of IV fluids; advised about benefits. States she is 100% sure she does not want fluids and does not want to be stuck again regardless of any benefit. Notified Heather, PA.

## 2014-09-18 NOTE — ED Provider Notes (Signed)
CSN: [id number]     Arrival date & time 09/18/14  1313 History   First MD Initiated Contact with Patient 09/18/14 1435     Chief Complaint  Patient presents with  . Nausea  . Emesis  . Diarrhea     (Consider location/radiation/quality/duration/timing/severity/associated sxs/prior Treatment) HPI Comments: Patient presents today with a chief complaint of cough.  She also reports one episode of vomiting and one episode of loose stool, but states that her primary concern is the cough.  She states that the cough has been present for the past 5 days and has been productive of sputum.  Cough gradually worsening.  She has taken Nyquil for her symptoms without relief.  She reports mild associated SOB and some tightness in her chest, but only with coughing.  She reports she has been around sick contacts with similar symptoms.  She also reports nausea and states that she had one episode of vomiting and one episode of loose stool yesterday.  No blood in her emesis or blood in our stool.  She denies fever, chills, hemoptysis, abdominal pain, urinary symptoms, sinus pain, sore throat, or congestion.  No history of PE or DVT.   No exogenous estrogen use.  No prolonged travel or surgeries in the past 4 weeks.    The history is provided by the patient.    History reviewed. No pertinent past medical history. History reviewed. No pertinent past surgical history. History reviewed. No pertinent family history. History  Substance Use Topics  . Smoking status: Current Every Day Smoker -- 0.50 packs/day  . Smokeless tobacco: Not on file  . Alcohol Use: No   OB History    No data available     Review of Systems  All other systems reviewed and are negative.     Allergies  Codeine  Home Medications   Prior to Admission medications   Medication Sig Start Date End Date Taking? Authorizing Provider  ALPRAZolam Prudy Feeler) 0.5 MG tablet Take 0.5 mg by mouth 3 (three) times daily.   Yes Historical Provider, MD   amphetamine-dextroamphetamine (ADDERALL) 20 MG tablet Take 20 mg by mouth daily.   Yes Historical Provider, MD  Brexpiprazole (REXULTI) 3 MG TABS Take 1 tablet by mouth at bedtime.   Yes Historical Provider, MD  ibuprofen (ADVIL,MOTRIN) 600 MG tablet Take 1 tablet (600 mg total) by mouth every 6 (six) hours as needed. 01/20/14  Yes Burgess Amor, PA-C  lamoTRIgine (LAMICTAL) 25 MG tablet Take 25 mg by mouth 2 (two) times daily.   Yes Historical Provider, MD  naproxen sodium (ANAPROX) 220 MG tablet Take 440 mg by mouth 2 (two) times daily as needed (pain).   Yes Historical Provider, MD  cyclobenzaprine (FLEXERIL) 5 MG tablet Take 1 tablet (5 mg total) by mouth 3 (three) times daily as needed for muscle spasms. Patient not taking: Reported on 09/18/2014 02/24/14   Burgess Amor, PA-C  HYDROcodone-acetaminophen (NORCO/VICODIN) 5-325 MG per tablet Take 1 tablet by mouth every 4 (four) hours as needed. Patient not taking: Reported on 09/18/2014 01/20/14   Burgess Amor, PA-C  traMADol (ULTRAM) 50 MG tablet Take 1 tablet (50 mg total) by mouth every 6 (six) hours as needed. Patient not taking: Reported on 09/18/2014 02/24/14   Burgess Amor, PA-C   BP 101/69 mmHg  Pulse 63  Temp(Src) 98 F (36.7 C) (Oral)  Resp 18  Ht  (1.575 m)  Wt 120 lb (54.432 kg)  BMI 21.94 kg/m2  SpO2 95%  LMP 09/04/2014  Physical Exam  Constitutional: She appears well-developed and well-nourished.  HENT:  Head: Normocephalic and atraumatic.  Mouth/Throat: Oropharynx is clear and moist.  Neck: Normal range of motion. Neck supple.  Cardiovascular: Normal rate, regular rhythm and normal heart sounds.   Pulmonary/Chest: Effort normal. She has wheezes.  Mild diffuse expiratory wheezing.  Abdominal: Soft. Bowel sounds are normal. She exhibits no distension and no mass. There is no tenderness. There is no rebound and no guarding.  Musculoskeletal: Normal range of motion.  Neurological: She is alert.  Skin: Skin is warm and dry.   Psychiatric: She has a normal mood and affect.  Nursing note and vitals reviewed.   ED Course  Procedures (including critical care time) Labs Review Labs Reviewed  CBC WITH DIFFERENTIAL/PLATELET - Abnormal; Notable for the following:    WBC 14.7 (*)    Hemoglobin 15.4 (*)    Neutrophils Relative % 78 (*)    Neutro Abs 11.3 (*)    All other components within normal limits  COMPREHENSIVE METABOLIC PANEL - Abnormal; Notable for the following:    Creatinine, Ser 1.17 (*)    ALT 11 (*)    All other components within normal limits  URINALYSIS, ROUTINE W REFLEX MICROSCOPIC (NOT AT [facility]) - Abnormal; Notable for the following:    Color, Urine AMBER (*)    APPearance HAZY (*)    Specific Gravity, Urine 1.037 (*)    Bilirubin Urine SMALL (*)    Ketones, ur 15 (*)    All other components within normal limits  POC URINE PREG, ED    Imaging Review Dg Chest 2 View  09/18/2014   CLINICAL DATA:  Cough with fever for 5 days  EXAM: CHEST  2 VIEW  COMPARISON:  04/01/2008  FINDINGS: Stable mild reticulated markings at the left lung base. Stable calcified pulmonary nodule in the peripheral right lower lobe. There is no edema, consolidation, effusion, or pneumothorax. Normal heart size and mediastinal contours.  IMPRESSION: No active cardiopulmonary disease.   Electronically Signed   By: Marnee Spring M.D.   On: 09/18/2014 15:46     EKG Interpretation   Date/Time:  Thursday September 18 2014 14:53:06 EDT Ventricular Rate:  52 PR Interval:  111 QRS Duration: 90 QT Interval:  427 QTC Calculation: 397 R Axis:   59 Text Interpretation:  Sinus rhythm Borderline short PR interval ED  PHYSICIAN INTERPRETATION AVAILABLE IN [facility] Confirmed by TEST,  Record ([id number]) on 09/19/2014 7:21:56 AM      MDM   Final diagnoses:  Cough   Patient presents today with a chief complaint of cough x 5 days.  Lungs with slight wheezing on exam.  Patient given Prednisone and Duoneb breathing treatment with  some improvement in symptoms.  CXR is negative for acute findings.  VSS.  No hypoxia.  Patient reported one episode of vomiting, but tolerating PO liquids in the ED without difficulty.  Feel that the patient is stable for discharge.  Return precautions given.      Santiago Glad, PA-C 09/19/14 2251  Arby Barrette, MD 09/20/14 [phone]

## 2014-11-12 ENCOUNTER — Emergency Department (HOSPITAL_COMMUNITY): Payer: Medicaid Other

## 2014-11-12 ENCOUNTER — Emergency Department (HOSPITAL_COMMUNITY)
Admission: EM | Admit: 2014-11-12 | Discharge: 2014-11-12 | Discharge status: Against Medical Advice | Payer: Medicaid Other | Attending: Emergency Medicine | Admitting: Emergency Medicine

## 2014-11-12 ENCOUNTER — Encounter (HOSPITAL_COMMUNITY): Payer: Self-pay | Admitting: Emergency Medicine

## 2014-11-12 DIAGNOSIS — Z23 Encounter for immunization: Secondary | ICD-10-CM | POA: Diagnosis not present

## 2014-11-12 DIAGNOSIS — Z79899 Other long term (current) drug therapy: Secondary | ICD-10-CM | POA: Diagnosis not present

## 2014-11-12 DIAGNOSIS — R110 Nausea: Secondary | ICD-10-CM | POA: Insufficient documentation

## 2014-11-12 DIAGNOSIS — Z3A01 Less than 8 weeks gestation of pregnancy: Secondary | ICD-10-CM | POA: Insufficient documentation

## 2014-11-12 DIAGNOSIS — F17210 Nicotine dependence, cigarettes, uncomplicated: Secondary | ICD-10-CM | POA: Insufficient documentation

## 2014-11-12 DIAGNOSIS — O209 Hemorrhage in early pregnancy, unspecified: Secondary | ICD-10-CM | POA: Diagnosis present

## 2014-11-12 DIAGNOSIS — O4690 Antepartum hemorrhage, unspecified, unspecified trimester: Secondary | ICD-10-CM

## 2014-11-12 DIAGNOSIS — O9989 Other specified diseases and conditions complicating pregnancy, childbirth and the puerperium: Secondary | ICD-10-CM | POA: Insufficient documentation

## 2014-11-12 DIAGNOSIS — O039 Complete or unspecified spontaneous abortion without complication: Secondary | ICD-10-CM | POA: Diagnosis not present

## 2014-11-12 DIAGNOSIS — O99331 Smoking (tobacco) complicating pregnancy, first trimester: Secondary | ICD-10-CM | POA: Insufficient documentation

## 2014-11-12 LAB — HCG, QUANTITATIVE, PREGNANCY

## 2014-11-12 LAB — BASIC METABOLIC PANEL
Anion gap: 8 (ref 5–15)
BUN: 9 mg/dL (ref 6–20)
CALCIUM: 8.9 mg/dL (ref 8.9–10.3)
CO2: 25 mmol/L (ref 22–32)
Chloride: 108 mmol/L (ref 101–111)
Creatinine, Ser: 0.96 mg/dL (ref 0.44–1.00)
GFR calc Af Amer: >60 mL/min (ref >60)
GFR calc non Af Amer: >60 mL/min (ref >60)
Glucose, Bld: 99 mg/dL (ref 65–99)
Potassium: 3.6 mmol/L (ref 3.5–5.1)
Sodium: 141 mmol/L (ref 135–145)

## 2014-11-12 LAB — CBC WITH DIFFERENTIAL/PLATELET
Basophils Absolute: 0 10*3/uL (ref 0.0–0.1)
Basophils Relative: 0 % (ref 0–1)
EOS PCT: 2 % (ref 0–5)
Eosinophils Absolute: 0.2 10*3/uL (ref 0.0–0.7)
HCT: 42.5 % (ref 36.0–46.0)
HEMOGLOBIN: 14.3 g/dL (ref 12.0–15.0)
LYMPHS ABS: 2.3 10*3/uL (ref 0.7–4.0)
Lymphocytes Relative: 25 % (ref 12–46)
MCH: 30.9 pg (ref 26.0–34.0)
MCHC: 33.6 g/dL (ref 30.0–36.0)
MCV: 91.8 fL (ref 78.0–100.0)
Monocytes Absolute: 0.4 10*3/uL (ref 0.1–1.0)
Monocytes Relative: 4 % (ref 3–12)
Neutro Abs: 6.1 10*3/uL (ref 1.7–7.7)
Neutrophils Relative %: 69 % (ref 43–77)
PLATELETS: 219 10*3/uL (ref 150–400)
RBC: 4.63 MIL/uL (ref 3.87–5.11)
RDW: 13.5 % (ref 11.5–15.5)
WBC: 9 10*3/uL (ref 4.0–10.5)

## 2014-11-12 LAB — ABO/RH: ABO/RH(D): A POS

## 2014-11-12 LAB — PREGNANCY, URINE: Preg Test, Ur: NEGATIVE

## 2014-11-12 MED ORDER — TETANUS-DIPHTH-ACELL PERTUSSIS 5-2.5-18.5 LF-MCG/0.5 IM SUSP
0.5000 mL | Freq: Once | INTRAMUSCULAR | Status: AC
  Administered 2014-11-12: 0.5 mL via INTRAMUSCULAR
  Filled 2014-11-12: qty 0.5

## 2014-11-12 NOTE — ED Notes (Signed)
Assigned to patient in error.

## 2014-11-12 NOTE — ED Notes (Signed)
Patient came from room to nursing station and stated "I'm ready to leave. I have a kid at home I need to get to. I've been here two hours, I've had a miscarriage, and there's nothing else they can do for me. I just want to go home." Patient advised she would be signing out AMA. Patient verbalized understanding. Advised Dr Deretha Emory.

## 2014-11-12 NOTE — ED Provider Notes (Addendum)
CSN: [id number]     Arrival date & time 11/12/14  0912 History  This chart was scribed for Vanetta Mulders, MD by Elon Spanner, ED Scribe. This patient was seen in room APA12/APA12 and the patient's care was started at 9:35 AM.   Chief Complaint  Patient presents with  . Vaginal Bleeding    [redacted] weeks pregnant   The history is provided by the patient. No language interpreter was used.   HPI Comments: Jennifer Ewing is a 34 y.o. female with a positive blood pregnancy test 3 weeks ago who presents complaining of vaginal bleeding onset this morning.  She reports associated lower back pain onset 1 week ago with worsening this morning and onset of abdominal pain this morning.  Her pain currently is rated 8/10.  LNMP 1 month ago.  G2P1A0  The patient also reports she stepped on a tac 3-4 days ago, causing a small, non-bleeding puncture.  Tetanus status unknown.    History reviewed. No pertinent past medical history. History reviewed. No pertinent past surgical history. History reviewed. No pertinent family history. History  Substance Use Topics  . Smoking status: Current Every Day Smoker -- 0.50 packs/day  . Smokeless tobacco: Not on file  . Alcohol Use: No   OB History    Gravida Para Term Preterm AB TAB SAB Ectopic Multiple Living   1         1     Review of Systems  Constitutional: Negative for fever and chills.  HENT: Negative for rhinorrhea and sore throat.   Eyes: Negative for visual disturbance.  Respiratory: Negative for cough and shortness of breath.   Cardiovascular: Negative for chest pain and leg swelling.  Gastrointestinal: Positive for nausea and abdominal pain. Negative for vomiting and diarrhea.  Genitourinary: Positive for vaginal bleeding. Negative for dysuria.  Musculoskeletal: Positive for back pain. Negative for neck pain.  Skin: Positive for wound. Negative for rash.  Neurological: Negative for headaches.  Hematological: Does not bruise/bleed easily.   Psychiatric/Behavioral: Negative for confusion.      Allergies  Codeine  Home Medications   Prior to Admission medications   Medication Sig Start Date End Date Taking? Authorizing Provider  acetaminophen (TYLENOL) 325 MG tablet Take 650 mg by mouth every 6 (six) hours as needed for moderate pain.   Yes Historical Provider, MD  albuterol (PROVENTIL HFA;VENTOLIN HFA) 108 (90 BASE) MCG/ACT inhaler Inhale 1-2 puffs into the lungs every 6 (six) hours as needed for wheezing or shortness of breath. 09/18/14  Yes Heather Laisure, PA-C  ALPRAZolam Prudy Feeler) 0.5 MG tablet Take 0.5 mg by mouth 3 (three) times daily.   Yes Historical Provider, MD  amphetamine-dextroamphetamine (ADDERALL) 20 MG tablet Take 20 mg by mouth daily.   Yes Historical Provider, MD  Brexpiprazole (REXULTI) 3 MG TABS Take 1 tablet by mouth at bedtime.   Yes Historical Provider, MD  lamoTRIgine (LAMICTAL) 25 MG tablet Take 25 mg by mouth 2 (two) times daily.   Yes Historical Provider, MD  ondansetron (ZOFRAN) 4 MG tablet Take 1 tablet (4 mg total) by mouth every 6 (six) hours. Patient not taking: Reported on 11/12/2014 09/18/14   Santiago Glad, PA-C  predniSONE (DELTASONE) 20 MG tablet Take 3 tablets (60 mg total) by mouth daily. Patient not taking: Reported on 11/12/2014 09/18/14   Santiago Glad, PA-C  traMADol (ULTRAM) 50 MG tablet Take 1 tablet (50 mg total) by mouth every 6 (six) hours as needed. Patient not taking: Reported on 09/18/2014 02/24/14  Burgess Amor, PA-C   BP 99/64 mmHg  Pulse 67  Temp(Src) 98.2 F (36.8 C)  Resp 16  Ht 5\' 2"  (1.575 m)  Wt 130 lb (58.968 kg)  BMI 23.77 kg/m2  SpO2 99%  LMP 09/04/2014 Physical Exam  Constitutional: She is oriented to person, place, and time. She appears well-developed and well-nourished. No distress.  HENT:  Head: Normocephalic and atraumatic.  Mouth/Throat: Oropharynx is clear and moist.  Eyes: Conjunctivae and EOM are normal.  Pupils normal.  Sclera clear.  Eyes tracking  normal.    Neck: Neck supple. No tracheal deviation present.  Cardiovascular: Normal rate and regular rhythm.   Pulmonary/Chest: Effort normal. No respiratory distress.  Lungs CTA bilaterally.    Abdominal: Soft. Bowel sounds are normal. There is no tenderness.  Flat, soft.   Musculoskeletal: Normal range of motion. She exhibits no edema.  Neurological: She is alert and oriented to person, place, and time. No cranial nerve deficit. She exhibits normal muscle tone. Coordination normal.  Skin: Skin is warm and dry.  Little brown spec (size of pen tip) on the ball of her right foot no redness or tenderness.   Psychiatric: She has a normal mood and affect. Her behavior is normal.  Nursing note and vitals reviewed.   ED Course  Procedures (including critical care time)  DIAGNOSTIC STUDIES: Oxygen Saturation is 99% on RA, normal by my interpretation.    COORDINATION OF CARE:  9:43 AM Discussed treatment plan with patient at bedside.  Patient acknowledges and agrees with plan.    Labs Review Labs Reviewed  PREGNANCY, URINE  BASIC METABOLIC PANEL  CBC WITH DIFFERENTIAL/PLATELET  HCG, QUANTITATIVE, PREGNANCY  ABO/RH   Results for orders placed or performed during the hospital encounter of 11/12/14  Pregnancy, urine  Result Value Ref Range   Preg Test, Ur NEGATIVE NEGATIVE  Basic metabolic panel  Result Value Ref Range   Sodium 141 135 - 145 mmol/L   Potassium 3.6 3.5 - 5.1 mmol/L   Chloride 108 101 - 111 mmol/L   CO2 25 22 - 32 mmol/L   Glucose, Bld 99 65 - 99 mg/dL   BUN 9 6 - 20 mg/dL   Creatinine, Ser 6.38 0.44 - 1.00 mg/dL   Calcium 8.9 8.9 - 75.6 mg/dL   GFR calc non Af Amer >60 >60 mL/min   GFR calc Af Amer >60 >60 mL/min   Anion gap 8 5 - 15  CBC with Differential/Platelet  Result Value Ref Range   WBC 9.0 4.0 - 10.5 K/uL   RBC 4.63 3.87 - 5.11 MIL/uL   Hemoglobin 14.3 12.0 - 15.0 g/dL   HCT 43.3 29.5 - 18.8 %   MCV 91.8 78.0 - 100.0 fL   MCH 30.9 26.0 - 34.0 pg    MCHC 33.6 30.0 - 36.0 g/dL   RDW 41.6 60.6 - 30.1 %   Platelets 219 150 - 400 K/uL   Neutrophils Relative % 69 43 - 77 %   Neutro Abs 6.1 1.7 - 7.7 K/uL   Lymphocytes Relative 25 12 - 46 %   Lymphs Abs 2.3 0.7 - 4.0 K/uL   Monocytes Relative 4 3 - 12 %   Monocytes Absolute 0.4 0.1 - 1.0 K/uL   Eosinophils Relative 2 0 - 5 %   Eosinophils Absolute 0.2 0.0 - 0.7 K/uL   Basophils Relative 0 0 - 1 %   Basophils Absolute 0.0 0.0 - 0.1 K/uL  hCG, quantitative, pregnancy  Result Value Ref  Range   hCG, Beta Chain, Quant, S <1 <5 mIU/mL  ABO/Rh  Result Value Ref Range   ABO/RH(D) A POS     Imaging Review No results found.   EKG Interpretation None      MDM   Final diagnoses:  Miscarriage   The patient left AMA prior to return of the labs. Patient is a positive cerumen grams not a concern. Patient without significant anemia. Quantitative hCG is basically 0. Patient states there was positive pregnancy test 3 weeks ago. Most likely there is been a miscarriage in the vaginal bleeding is related to that.  Was planning on doing the pelvic exam the patient left.  I personally performed the services described in this documentation, which was scribed in my presence. The recorded information has been reviewed and is accurate.     Vanetta Mulders, MD 11/12/14 1133  Vanetta Mulders, MD 11/12/14 1134

## 2014-11-12 NOTE — ED Notes (Signed)
Pt is [redacted] weeks pregnant and started heavy bleeding this am.  C/o back and abdominal pain 8/10.  Pt also said she stepped on a rusty nail 3-4 days ago and need a tetanus.

## 2019-03-13 ENCOUNTER — Encounter (HOSPITAL_COMMUNITY): Payer: Self-pay

## 2019-03-13 ENCOUNTER — Other Ambulatory Visit: Payer: Self-pay

## 2019-03-13 ENCOUNTER — Emergency Department (HOSPITAL_COMMUNITY): Payer: Medicaid Other

## 2019-03-13 ENCOUNTER — Emergency Department (HOSPITAL_COMMUNITY)
Admission: EM | Admit: 2019-03-13 | Discharge: 2019-03-13 | Disposition: A | Discharge status: Home / Self Care | Payer: Medicaid Other | Attending: Emergency Medicine | Admitting: Emergency Medicine

## 2019-03-13 DIAGNOSIS — W450XXA Nail entering through skin, initial encounter: Secondary | ICD-10-CM | POA: Diagnosis not present

## 2019-03-13 DIAGNOSIS — S99921A Unspecified injury of right foot, initial encounter: Secondary | ICD-10-CM | POA: Diagnosis not present

## 2019-03-13 DIAGNOSIS — Z79899 Other long term (current) drug therapy: Secondary | ICD-10-CM | POA: Diagnosis not present

## 2019-03-13 DIAGNOSIS — Y9389 Activity, other specified: Secondary | ICD-10-CM | POA: Diagnosis not present

## 2019-03-13 DIAGNOSIS — Y929 Unspecified place or not applicable: Secondary | ICD-10-CM | POA: Insufficient documentation

## 2019-03-13 DIAGNOSIS — F17200 Nicotine dependence, unspecified, uncomplicated: Secondary | ICD-10-CM | POA: Diagnosis not present

## 2019-03-13 DIAGNOSIS — Y999 Unspecified external cause status: Secondary | ICD-10-CM | POA: Insufficient documentation

## 2019-03-13 DIAGNOSIS — M79671 Pain in right foot: Secondary | ICD-10-CM | POA: Diagnosis not present

## 2019-03-13 DIAGNOSIS — Z23 Encounter for immunization: Secondary | ICD-10-CM | POA: Diagnosis not present

## 2019-03-13 DIAGNOSIS — S99821A Other specified injuries of right foot, initial encounter: Secondary | ICD-10-CM | POA: Diagnosis present

## 2019-03-13 DIAGNOSIS — S91331A Puncture wound without foreign body, right foot, initial encounter: Secondary | ICD-10-CM | POA: Diagnosis not present

## 2019-03-13 HISTORY — DX: Attention-deficit hyperactivity disorder, unspecified type: F90.9

## 2019-03-13 MED ORDER — DOXYCYCLINE HYCLATE 100 MG PO CAPS: 100.0000 mg | ORAL_CAPSULE | Freq: Two times a day (BID) | ORAL | 0 refills | Status: DC

## 2019-03-13 MED ORDER — NAPROXEN 500 MG PO TABS: 500.0000 mg | ORAL_TABLET | Freq: Two times a day (BID) | ORAL | 0 refills | Status: DC

## 2019-03-13 MED ORDER — DOXYCYCLINE HYCLATE 100 MG PO TABS
100.0000 mg | ORAL_TABLET | Freq: Once | ORAL | Status: AC
  Administered 2019-03-13: 100 mg via ORAL
  Filled 2019-03-13: qty 1

## 2019-03-13 MED ORDER — NAPROXEN 250 MG PO TABS
500.0000 mg | ORAL_TABLET | Freq: Once | ORAL | Status: AC
  Administered 2019-03-13: 13:00:00 500 mg via ORAL
  Filled 2019-03-13: qty 2

## 2019-03-13 MED ORDER — TETANUS-DIPHTH-ACELL PERTUSSIS 5-2.5-18.5 LF-MCG/0.5 IM SUSP
0.5000 mL | Freq: Once | INTRAMUSCULAR | Status: AC
Start: 2019-03-13 — End: 2019-03-13
  Administered 2019-03-13: 13:00:00 0.5 mL via INTRAMUSCULAR
  Filled 2019-03-13: qty 0.5

## 2019-03-13 NOTE — ED Provider Notes (Signed)
[facility] EMERGENCY DEPARTMENT Provider Note   CSN: [id number] Arrival date & time: 03/13/19  1148     History   Chief Complaint Chief Complaint  Patient presents with  . Foot Pain    HPI Jennifer Ewing is a 38 y.o. female.     Jennifer Ewing is a 38 y.o. female who presents for evaluation of pain on the bottom of her right foot.  She reports 1 week ago she stepped on a screw, she is not sure how far it went into her foot but she was able to quickly remove it.  Patient was barefoot.  She had some bleeding initially from the foot which she was able to stop. She reports she she has continued to experience pain over the bottom of her foot and she has noted a small amount of surrounding redness. Pain is worse with weight bearing and pt has to work 12 hour shifts where she is on her feet most of the time. No purulent drainage or red streaking, No swelling. No fevers or chills. Unsure of last tetanus vaccine. No other aggravating or alleviating factors.     Past Medical History:  Diagnosis Date  . ADHD     There are no active problems to display for this patient.   History reviewed. No pertinent surgical history.   OB History    Gravida  1   Para      Term      Preterm      AB      Living  1     SAB      TAB      Ectopic      Multiple      Live Births               Home Medications    Prior to Admission medications   Medication Sig Start Date End Date Taking? Authorizing Provider  acetaminophen (TYLENOL) 325 MG tablet Take 650 mg by mouth every 6 (six) hours as needed for moderate pain.    [provider]  albuterol (PROVENTIL HFA;VENTOLIN HFA) 108 (90 BASE) MCG/ACT inhaler Inhale 1-2 puffs into the lungs every 6 (six) hours as needed for wheezing or shortness of breath. 09/18/14   Santiago Glad, PA-C  ALPRAZolam Prudy Feeler) 0.5 MG tablet Take 0.5 mg by mouth 3 (three) times daily.    [provider]   amphetamine-dextroamphetamine (ADDERALL) 20 MG tablet Take 20 mg by mouth daily.    [provider]  Brexpiprazole (REXULTI) 3 MG TABS Take 1 tablet by mouth at bedtime.    [provider]  doxycycline (VIBRAMYCIN) 100 MG capsule Take 1 capsule (100 mg total) by mouth 2 (two) times daily. One po bid x 7 days 03/13/19   Dartha Lodge, PA-C  lamoTRIgine (LAMICTAL) 25 MG tablet Take 25 mg by mouth 2 (two) times daily.    [provider]  naproxen (NAPROSYN) 500 MG tablet Take 1 tablet (500 mg total) by mouth 2 (two) times daily. 03/13/19   Dartha Lodge, PA-C  ondansetron (ZOFRAN) 4 MG tablet Take 1 tablet (4 mg total) by mouth every 6 (six) hours. Patient not taking: Reported on 11/12/2014 09/18/14   Santiago Glad, PA-C  predniSONE (DELTASONE) 20 MG tablet Take 3 tablets (60 mg total) by mouth daily. Patient not taking: Reported on 11/12/2014 09/18/14   Santiago Glad, PA-C  traMADol (ULTRAM) 50 MG tablet Take 1 tablet (50 mg total) by mouth every  6 (six) hours as needed. Patient not taking: Reported on 09/18/2014 02/24/14   Evalee Jefferson, PA-C    Family History No family history on file.  Social History Social History   Tobacco Use  . Smoking status: Current Every Day Smoker    Packs/day: 0.50  . Smokeless tobacco: Never Used  Substance Use Topics  . Alcohol use: No  . Drug use: No     Allergies   Codeine   Review of Systems Review of Systems  Constitutional: Negative for chills and fever.  Cardiovascular: Negative for leg swelling.  Musculoskeletal: Negative for arthralgias and joint swelling.  Skin: Positive for color change and wound.  Neurological: Negative for weakness and numbness.     Physical Exam Updated Vital Signs BP 110/73 (BP Location: Right Arm)   Pulse 88   Temp 97.8 F (36.6 C) (Oral)   Resp 18   Ht 5\' 1"  (1.549 m)   Wt 50.8 kg   LMP 03/12/2019   SpO2 100%   Breastfeeding Unknown   BMI 21.16 kg/m   Physical Exam Vitals  signs and nursing note reviewed.  Constitutional:      General: She is not in acute distress.    Appearance: She is well-developed. She is not diaphoretic.  HENT:     Head: Normocephalic and atraumatic.  Eyes:     General:        Right eye: No discharge.        Left eye: No discharge.  Pulmonary:     Effort: Pulmonary effort is normal. No respiratory distress.  Musculoskeletal:     Comments: Small puncture wound to the bottom of the right foot with small area of surrounding erythema, no palpable fluctuance or induration to suggest abscess, no surrounding edema, no lymphangitic streaking noted.  Distal pulses 2+, normal sensation and strength.  Skin:    General: Skin is warm and dry.  Neurological:     Mental Status: She is alert and oriented to person, place, and time.     Coordination: Coordination normal.  Psychiatric:        Mood and Affect: Mood normal.        Behavior: Behavior normal.      ED Treatments / Results  Labs (all labs ordered are listed, but only abnormal results are displayed) Labs Reviewed - No data to display  EKG None  Radiology Dg Foot Complete Right  Result Date: 03/13/2019 CLINICAL DATA:  Right foot injury. Right foot pain after stepping on a screw last Thursday. EXAM: RIGHT FOOT COMPLETE - 3+ VIEW COMPARISON:  None. FINDINGS: No fracture or bone lesion. Joints are normally spaced and aligned.  No arthropathic changes. Soft tissues are unremarkable.  No radiopaque foreign body. IMPRESSION: Negative. Electronically Signed   By: Lajean Manes M.D.   On: 03/13/2019 12:44     Procedures Procedures (including critical care time)  Medications Ordered in ED Medications  naproxen (NAPROSYN) tablet 500 mg (500 mg Oral Given 03/13/19 1251)  Tdap (BOOSTRIX) injection 0.5 mL (0.5 mLs Intramuscular Given 03/13/19 1251)  doxycycline (VIBRA-TABS) tablet 100 mg (100 mg Oral Given 03/13/19 1337)     Initial Impression / Assessment and Plan / ED Course  I have  reviewed the triage vital signs and the nursing notes.  Pertinent labs & imaging results that were available during my care of the patient were reviewed by me and considered in my medical decision making (see chart for details).  38 year old female presents with continued pain  after puncture wound to the bottom of the right foot 1 week ago.  She has a small amount of erythema but no palpable abscess.  Foot is neurovascularly intact.  X-ray shows no foreign bodies or bony damage.  Tetanus updated.  Will place patient on course of doxycycline for localized skin infection.  Patient was not wearing shoes so no need to cover for Pseudomonas.  Wound care and return precautions discussed.  Patient discharged home in good condition.  Final Clinical Impressions(s) / ED Diagnoses   Final diagnoses:  Puncture wound of right foot, initial encounter    ED Discharge Orders         Ordered    doxycycline (VIBRAMYCIN) 100 MG capsule  2 times daily     03/13/19 1313    naproxen (NAPROSYN) 500 MG tablet  2 times daily     03/13/19 1315           Legrand RamsFord, Letzy Gullickson N, PA-C 03/15/19 2207    Vanetta MuldersZackowski, Scott, MD 03/16/19 1520

## 2019-03-13 NOTE — Discharge Instructions (Signed)
Please take antibiotics twice daily as directed for the next 7 days, take this with food.  Use Aleve twice daily to help relieve pain, you can take Tylenol in addition to this you can ice and elevate the foot.  Try and stay off of your feet for the next few days to help with pain.  If you notice increasing redness worsening pain or if the area becomes hard to the touch, or begins draining purulent fluid return to the ED.

## 2019-03-13 NOTE — ED Notes (Signed)
Patient transported to X-ray 

## 2019-03-13 NOTE — ED Triage Notes (Signed)
Pt presents to ED with complaints of right foot pain after stepping on screw on Thanksgiving.

## 2019-04-17 DIAGNOSIS — Z79891 Long term (current) use of opiate analgesic: Secondary | ICD-10-CM | POA: Diagnosis not present

## 2019-05-09 DIAGNOSIS — Z79891 Long term (current) use of opiate analgesic: Secondary | ICD-10-CM | POA: Diagnosis not present

## 2019-06-30 ENCOUNTER — Other Ambulatory Visit: Payer: Self-pay

## 2019-06-30 ENCOUNTER — Emergency Department (HOSPITAL_COMMUNITY): Payer: Medicaid Other

## 2019-06-30 ENCOUNTER — Encounter (HOSPITAL_COMMUNITY): Payer: Self-pay

## 2019-06-30 ENCOUNTER — Emergency Department (HOSPITAL_COMMUNITY)
Admission: EM | Admit: 2019-06-30 | Discharge: 2019-06-30 | Disposition: A | Discharge status: Home / Self Care | Payer: Medicaid Other | Attending: Emergency Medicine | Admitting: Emergency Medicine

## 2019-06-30 DIAGNOSIS — F17210 Nicotine dependence, cigarettes, uncomplicated: Secondary | ICD-10-CM | POA: Diagnosis not present

## 2019-06-30 DIAGNOSIS — Z79899 Other long term (current) drug therapy: Secondary | ICD-10-CM | POA: Insufficient documentation

## 2019-06-30 DIAGNOSIS — M79671 Pain in right foot: Secondary | ICD-10-CM | POA: Diagnosis not present

## 2019-06-30 DIAGNOSIS — S99921A Unspecified injury of right foot, initial encounter: Secondary | ICD-10-CM | POA: Diagnosis not present

## 2019-06-30 MED ORDER — INDOMETHACIN 50 MG PO CAPS: 50.0000 mg | ORAL_CAPSULE | Freq: Two times a day (BID) | ORAL | 0 refills | Status: DC

## 2019-06-30 MED ORDER — HYDROCODONE-ACETAMINOPHEN 5-325 MG PO TABS: ORAL_TABLET | ORAL | 0 refills | Status: AC

## 2019-06-30 MED ORDER — INDOMETHACIN 25 MG PO CAPS
50.0000 mg | ORAL_CAPSULE | Freq: Once | ORAL | Status: AC
  Administered 2019-06-30: 50 mg via ORAL
  Filled 2019-06-30: qty 2

## 2019-06-30 MED ORDER — HYDROCODONE-ACETAMINOPHEN 5-325 MG PO TABS
1.0000 | ORAL_TABLET | Freq: Once | ORAL | Status: AC
  Administered 2019-06-30: 1 via ORAL
  Filled 2019-06-30: qty 1

## 2019-06-30 NOTE — ED Triage Notes (Signed)
Pt reports right foot pain specially right medial side below great toe x 2 daysNo injury reported

## 2019-06-30 NOTE — Discharge Instructions (Addendum)
Elevate your foot when possible.  Try to wear shoes that fit well and support your foot.  Follow-up with your primary doctor for recheck or you may contact the orthopedic provider listed to arrange a follow-up appointment in 1 week if not improving.  Stop taking the indomethacin as soon as your pain improves.

## 2019-07-01 NOTE — ED Provider Notes (Signed)
[facility] EMERGENCY DEPARTMENT Provider Note   CSN: [id number] Arrival date & time: 06/30/19  1034     History Chief Complaint  Patient presents with  . Foot Pain    Jennifer Ewing is a 39 y.o. female.  HPI      Jennifer Ewing is a 39 y.o. female who presents to the Emergency Department complaining of pain to her right foot and proximal right great toe.  Symptoms have been present for 2 days.  She describes a sharp, intense pain that worsens with palpation and weight bearing.  Improves at rest.  No known injury.  Stands and walks for long hours at her job.  She describes pain so severe that she can't tolerate any pressure to the proximal great toe or across the ball of her foot.  She notes swelling of the joint one day prior.   Past Medical History:  Diagnosis Date  . ADHD     There are no problems to display for this patient.   History reviewed. No pertinent surgical history.   OB History    Gravida  1   Para      Term      Preterm      AB      Living  1     SAB      TAB      Ectopic      Multiple      Live Births              No family history on file.  Social History   Tobacco Use  . Smoking status: Current Every Day Smoker    Packs/day: 0.50  . Smokeless tobacco: Never Used  Substance Use Topics  . Alcohol use: No  . Drug use: No    Home Medications Prior to Admission medications   Medication Sig Start Date End Date Taking? Authorizing Provider  acetaminophen (TYLENOL) 325 MG tablet Take 650 mg by mouth every 6 (six) hours as needed for moderate pain.   Yes [provider]  albuterol (PROVENTIL HFA;VENTOLIN HFA) 108 (90 BASE) MCG/ACT inhaler Inhale 1-2 puffs into the lungs every 6 (six) hours as needed for wheezing or shortness of breath. 09/18/14  Yes Hyman Bible, PA-C  ALPRAZolam Duanne Moron) 0.5 MG tablet Take 0.5 mg by mouth 3 (three) times daily.   Yes [provider]  amphetamine-dextroamphetamine  (ADDERALL) 30 MG tablet Take 1 tablet by mouth 2 (two) times daily. 06/13/19  Yes [provider]  Brexpiprazole (REXULTI) 3 MG TABS Take 1 tablet by mouth daily.    Yes [provider]  gabapentin (NEURONTIN) 600 MG tablet Take 600 mg by mouth 2 (two) times daily. 05/23/19  Yes [provider]  lamoTRIgine (LAMICTAL) 200 MG tablet Take 200 mg by mouth every morning. 05/23/19  Yes [provider]  HYDROcodone-acetaminophen (NORCO/VICODIN) 5-325 MG tablet Take one tab po q 4 hrs prn pain 06/30/19   Melitza Metheny, PA-C  indomethacin (INDOCIN) 50 MG capsule Take 1 capsule (50 mg total) by mouth 2 (two) times daily with a meal. 06/30/19   Aveena Bari, PA-C    Allergies    Codeine  Review of Systems   Review of Systems  Constitutional: Negative for chills and fever.  Respiratory: Negative for shortness of breath.   Cardiovascular: Negative for chest pain.  Musculoskeletal: Positive for arthralgias (right foot pain). Negative for back pain, joint swelling, myalgias and neck pain.  Skin: Negative for color  change, rash and wound.  Neurological: Negative for weakness and numbness.  All other systems reviewed and are negative.   Physical Exam Updated Vital Signs BP 121/82 (BP Location: Right Arm)   Pulse 87   Temp 98 F (36.7 C) (Oral)   Resp 16   Ht 4\' 9"  (1.448 m)   Wt 49.9 kg   LMP 06/29/2019   SpO2 100%   BMI 23.80 kg/m   Physical Exam Vitals and nursing note reviewed.  Constitutional:      General: She is not in acute distress.    Appearance: She is well-developed. She is not toxic-appearing.  HENT:     Head: Atraumatic.  Cardiovascular:     Rate and Rhythm: Normal rate and regular rhythm.     Pulses: Normal pulses.  Pulmonary:     Effort: Pulmonary effort is normal.     Breath sounds: Normal breath sounds.  Musculoskeletal:        General: Tenderness present. No swelling or signs of injury.     Right lower leg: No edema.     Left  lower leg: No edema.     Comments: Focal ttp of the first MT head.  No edema, erythema or excessive warmth of the joint.  Mild tenderness to the arch of the foot.  No proximal tenderness.  DP and PT pulses palpable and symmetrical.    Skin:    General: Skin is warm and dry.     Capillary Refill: Capillary refill takes less than 2 seconds.  Neurological:     General: No focal deficit present.     Mental Status: She is alert.     Sensory: No sensory deficit.     Motor: No weakness or abnormal muscle tone.     ED Results / Procedures / Treatments   Labs (all labs ordered are listed, but only abnormal results are displayed) Labs Reviewed - No data to display  EKG None  Radiology DG Foot Complete Right  Result Date: 06/30/2019 CLINICAL DATA:  Pt reports right foot pain specially right medial side below great toe x 2 daysNo injury reported. Pt denies any on body medical injector devices. EXAM: RIGHT FOOT COMPLETE - 3+ VIEW COMPARISON:  None. FINDINGS: There is no evidence of fracture or dislocation. There is no evidence of arthropathy or other focal bone abnormality. Soft tissues are unremarkable. IMPRESSION: Negative radiographs of the right foot. Electronically Signed   By: 07/02/2019 M.D.   On: 06/30/2019 11:05    Procedures Procedures (including critical care time)  Medications Ordered in ED Medications  HYDROcodone-acetaminophen (NORCO/VICODIN) 5-325 MG per tablet 1 tablet (1 tablet Oral Given 06/30/19 1209)  indomethacin (INDOCIN) capsule 50 mg (50 mg Oral Given 06/30/19 1209)    ED Course  I have reviewed the triage vital signs and the nursing notes.  Pertinent labs & imaging results that were available during my care of the patient were reviewed by me and considered in my medical decision making (see chart for details).    MDM Rules/Calculators/A&P                      Pt with pain inconsistent with exam findings.  Tenderness is focal to the first MT head.  Felt to  be likely gout.  Doubt cellulitis.  Extremity is NV intact.  No proximal tenderness.  XR neg for acute bony injury.   Will tx symptomatically, orthopedic f/u if needed and pt given post op shoe  for support.     Final Clinical Impression(s) / ED Diagnoses Final diagnoses:  Foot pain, right    Rx / DC Orders ED Discharge Orders         Ordered    indomethacin (INDOCIN) 50 MG capsule  2 times daily with meals     06/30/19 1157    HYDROcodone-acetaminophen (NORCO/VICODIN) 5-325 MG tablet     06/30/19 1157           Pauline Aus, PA-C 07/01/19 1750    Terald Sleeper, MD 07/02/19 1016

## 2019-07-08 ENCOUNTER — Encounter: Payer: Self-pay | Admitting: Podiatry

## 2019-07-08 ENCOUNTER — Other Ambulatory Visit: Payer: Self-pay

## 2019-07-08 ENCOUNTER — Ambulatory Visit (INDEPENDENT_AMBULATORY_CARE_PROVIDER_SITE_OTHER): Payer: BC Managed Care – PPO | Admitting: Podiatry

## 2019-07-08 VITALS — Temp 97.7°F

## 2019-07-08 DIAGNOSIS — M7741 Metatarsalgia, right foot: Secondary | ICD-10-CM | POA: Diagnosis not present

## 2019-07-08 DIAGNOSIS — M7742 Metatarsalgia, left foot: Secondary | ICD-10-CM | POA: Diagnosis not present

## 2019-07-08 DIAGNOSIS — M778 Other enthesopathies, not elsewhere classified: Secondary | ICD-10-CM

## 2019-07-08 MED ORDER — DICLOFENAC SODIUM 75 MG PO TBEC: 75.0000 mg | DELAYED_RELEASE_TABLET | Freq: Two times a day (BID) | ORAL | 1 refills | Status: DC

## 2019-07-08 MED ORDER — METHYLPREDNISOLONE 4 MG PO TBPK: ORAL_TABLET | ORAL | 0 refills | Status: DC

## 2019-07-10 NOTE — Progress Notes (Signed)
   HPI: 39 y.o. female presenting today as a new patient with a chief complaint of stabbing, burning pain of the bilateral feet, right worse than the left, that began one week ago. She reports associated redness and swelling of the feet. Walking and applying pressure to the feet increases the pain. She states she works 12+ hours daily five days per week. She was seen in the ED on 06/30/2019, had negative X-Rays, given a post op shoe and prescribed Indomethacin and Vicodin for treatment. Patient is here for further evaluation and treatment.   Past Medical History:  Diagnosis Date  . ADHD      Physical Exam: General: The patient is alert and oriented x3 in no acute distress.  Dermatology: Skin is warm, dry and supple bilateral lower extremities. Negative for open lesions or macerations.  Vascular: Palpable pedal pulses bilaterally. No edema or erythema noted. Capillary refill within normal limits.  Neurological: Epicritic and protective threshold grossly intact bilaterally.   Musculoskeletal Exam: Severe pain with palpation to the entire forefoot bilaterally, right worse than left. Range of motion within normal limits to all pedal and ankle joints bilateral. Muscle strength 5/5 in all groups bilateral.   Assessment: 1. Capsulitis / metatarsalgia bilateral, right worse than left    Plan of Care:  1. Patient evaluated. X-Rays from Epic on 06/30/2019 reviewed.  2. Prescription for Medrol Dose Pak provided to patient. 3. Prescription for Diclofenac provided to patient. 4. Declined injections.  5. Note for work provided. No work for one week.  6. Return to clinic in one week.   Works at [employer].       Felecia Shelling, DPM [facility]  Dr. Felecia Shelling, DPM    [address] [place], [place] [address]                Office [phone]  Fax [phone]

## 2019-07-22 ENCOUNTER — Ambulatory Visit: Payer: BC Managed Care – PPO | Admitting: Podiatry

## 2019-07-24 ENCOUNTER — Ambulatory Visit: Payer: Medicaid Other | Admitting: Podiatry

## 2019-08-01 DIAGNOSIS — Z79891 Long term (current) use of opiate analgesic: Secondary | ICD-10-CM | POA: Diagnosis not present

## 2019-08-27 DIAGNOSIS — Z79891 Long term (current) use of opiate analgesic: Secondary | ICD-10-CM | POA: Diagnosis not present

## 2019-09-29 ENCOUNTER — Other Ambulatory Visit: Payer: Self-pay

## 2019-09-29 ENCOUNTER — Emergency Department (HOSPITAL_COMMUNITY)
Admission: EM | Admit: 2019-09-29 | Discharge: 2019-09-30 | Disposition: A | Discharge status: Home / Self Care | Payer: Medicaid Other | Attending: Emergency Medicine | Admitting: Emergency Medicine

## 2019-09-29 ENCOUNTER — Emergency Department (HOSPITAL_COMMUNITY): Payer: Medicaid Other

## 2019-09-29 ENCOUNTER — Encounter (HOSPITAL_COMMUNITY): Payer: Self-pay | Admitting: Emergency Medicine

## 2019-09-29 DIAGNOSIS — Z5321 Procedure and treatment not carried out due to patient leaving prior to being seen by health care provider: Secondary | ICD-10-CM | POA: Diagnosis not present

## 2019-09-29 DIAGNOSIS — M79641 Pain in right hand: Secondary | ICD-10-CM | POA: Diagnosis not present

## 2019-09-29 DIAGNOSIS — M79644 Pain in right finger(s): Secondary | ICD-10-CM | POA: Diagnosis not present

## 2019-09-29 DIAGNOSIS — M79646 Pain in unspecified finger(s): Secondary | ICD-10-CM | POA: Insufficient documentation

## 2019-09-29 DIAGNOSIS — S6991XA Unspecified injury of right wrist, hand and finger(s), initial encounter: Secondary | ICD-10-CM | POA: Diagnosis not present

## 2019-09-29 NOTE — ED Triage Notes (Signed)
Larey Seat on a trash can and now thumb is painful   Swollen and tender to touch

## 2019-09-30 NOTE — ED Notes (Signed)
No answer in waiting room X1,  

## 2019-09-30 NOTE — ED Notes (Signed)
No answer in waiting room X2 

## 2019-09-30 NOTE — ED Notes (Signed)
No answer in waiting room X3 

## 2019-10-06 MED ORDER — BARIUM SULFATE 2.1 % PO SUSP
ORAL | Status: AC
  Filled 2019-10-06: qty 2

## 2019-10-26 ENCOUNTER — Emergency Department (HOSPITAL_COMMUNITY): Payer: Medicaid Other

## 2019-10-26 ENCOUNTER — Emergency Department (HOSPITAL_COMMUNITY)
Admission: EM | Admit: 2019-10-26 | Discharge: 2019-10-26 | Disposition: A | Discharge status: Home / Self Care | Payer: Medicaid Other | Attending: Emergency Medicine | Admitting: Emergency Medicine

## 2019-10-26 ENCOUNTER — Encounter (HOSPITAL_COMMUNITY): Payer: Self-pay

## 2019-10-26 ENCOUNTER — Other Ambulatory Visit: Payer: Self-pay

## 2019-10-26 DIAGNOSIS — Z5321 Procedure and treatment not carried out due to patient leaving prior to being seen by health care provider: Secondary | ICD-10-CM | POA: Insufficient documentation

## 2019-10-26 DIAGNOSIS — R0602 Shortness of breath: Secondary | ICD-10-CM | POA: Diagnosis not present

## 2019-10-26 DIAGNOSIS — R911 Solitary pulmonary nodule: Secondary | ICD-10-CM | POA: Diagnosis not present

## 2019-10-26 NOTE — ED Notes (Signed)
Pt left without being seen by provider. As she was walking down the hallway she pulled down her mask to cough.

## 2019-10-26 NOTE — ED Triage Notes (Signed)
Pt arrives from home via POV c/o SOB, cough congestion and generalized body aches. Pt reports daughter was recently Dx with viral infection by childs PCP. Symptoms have been present X 3 days. Pt has strong, non-productive cough, chest discomfort and reports taking 1 sudafed apprx 2 hrs prior to ED arrival.

## 2019-10-28 ENCOUNTER — Telehealth: Payer: Self-pay | Admitting: *Deleted

## 2019-10-28 NOTE — Telephone Encounter (Signed)
  Transition Care Management Follow-up Telephone Call  . Medicaid Managed Care Transition Call Status:MM [facility] Call Made  . Date of discharge and from where: [facility] 10/26/19 . How have you been since you were released from the hospital? "feels like I have been hit by a Mack truck"  . Any questions or concerns? Not seen by MD for 2 hrs; pt left without being seen  she has a follow up appt at [facility] on 11/08/19; encouraged pt to consider urgent care, and offered to contact pt's PCP to have her evaluated; Pt states she does not want to be seen at [facility] or [facility]; pt disconnected before remainder of assessment could be completed. Items Reviewed: Marland Kitchen Did the pt receive and understand the discharge instructions provided? . Medications obtained and verified? Marland Kitchen Any new allergies since your discharge? . Dietary orders reviewed? . Do you have support at home?  Functional Questionnaire: (I = Independent and D = Dependent)  ADLs:  Bathing/Dressing: Meal Prep: Eating:  Maintaining continence: Transferring/Ambulation:  Managing Meds:  Follow up appointments reviewed:  PCP Hospital f/u appt confirmed? Scheduled to see Dr Dyanne [facility] on 11/08/19 @ 1040.  Are transportation arrangements needed? n/a  If their condition worsens, is the pt aware to call PCP or go to the EmergencyDept.? n/a Was the patient provided with contact information for the PCP's office or ED? n/a  Was to pt encouraged to call back with questions or concerns? n/a

## 2019-10-28 NOTE — Telephone Encounter (Signed)
Will route to provider for notification.

## 2019-11-08 ENCOUNTER — Ambulatory Visit: Payer: Medicaid Other | Admitting: Internal Medicine

## 2019-11-26 DIAGNOSIS — F3181 Bipolar II disorder: Secondary | ICD-10-CM | POA: Diagnosis not present

## 2019-11-26 DIAGNOSIS — F902 Attention-deficit hyperactivity disorder, combined type: Secondary | ICD-10-CM | POA: Diagnosis not present

## 2020-01-08 ENCOUNTER — Other Ambulatory Visit: Payer: Medicaid Other

## 2020-01-08 ENCOUNTER — Other Ambulatory Visit: Payer: Self-pay | Admitting: Critical Care Medicine

## 2020-01-08 DIAGNOSIS — Z20822 Contact with and (suspected) exposure to covid-19: Secondary | ICD-10-CM

## 2020-01-09 LAB — NOVEL CORONAVIRUS, NAA: SARS-CoV-2, NAA: NOT DETECTED

## 2020-01-09 LAB — SARS-COV-2, NAA 2 DAY TAT

## 2020-01-14 ENCOUNTER — Other Ambulatory Visit: Payer: Medicaid Other

## 2020-01-15 ENCOUNTER — Other Ambulatory Visit: Payer: Medicaid Other

## 2020-01-15 ENCOUNTER — Other Ambulatory Visit: Payer: Self-pay

## 2020-01-15 DIAGNOSIS — Z20822 Contact with and (suspected) exposure to covid-19: Secondary | ICD-10-CM

## 2020-01-16 LAB — NOVEL CORONAVIRUS, NAA: SARS-CoV-2, NAA: NOT DETECTED

## 2020-01-16 LAB — SPECIMEN STATUS REPORT

## 2020-01-16 LAB — SARS-COV-2, NAA 2 DAY TAT

## 2020-01-23 ENCOUNTER — Emergency Department (HOSPITAL_COMMUNITY): Payer: Medicaid Other

## 2020-01-23 ENCOUNTER — Other Ambulatory Visit: Payer: Self-pay

## 2020-01-23 ENCOUNTER — Encounter (HOSPITAL_COMMUNITY): Payer: Self-pay | Admitting: Emergency Medicine

## 2020-01-23 ENCOUNTER — Emergency Department (HOSPITAL_COMMUNITY)
Admission: EM | Admit: 2020-01-23 | Discharge: 2020-01-23 | Discharge status: Against Medical Advice | Payer: Medicaid Other | Attending: Emergency Medicine | Admitting: Emergency Medicine

## 2020-01-23 DIAGNOSIS — S0990XA Unspecified injury of head, initial encounter: Secondary | ICD-10-CM | POA: Diagnosis not present

## 2020-01-23 DIAGNOSIS — J3489 Other specified disorders of nose and nasal sinuses: Secondary | ICD-10-CM | POA: Diagnosis not present

## 2020-01-23 DIAGNOSIS — M47812 Spondylosis without myelopathy or radiculopathy, cervical region: Secondary | ICD-10-CM | POA: Diagnosis not present

## 2020-01-23 DIAGNOSIS — R140 Abdominal distension (gaseous): Secondary | ICD-10-CM | POA: Diagnosis not present

## 2020-01-23 DIAGNOSIS — Y92410 Unspecified street and highway as the place of occurrence of the external cause: Secondary | ICD-10-CM | POA: Diagnosis not present

## 2020-01-23 DIAGNOSIS — J9859 Other diseases of mediastinum, not elsewhere classified: Secondary | ICD-10-CM | POA: Diagnosis not present

## 2020-01-23 DIAGNOSIS — Y9389 Activity, other specified: Secondary | ICD-10-CM | POA: Diagnosis not present

## 2020-01-23 DIAGNOSIS — F17200 Nicotine dependence, unspecified, uncomplicated: Secondary | ICD-10-CM | POA: Insufficient documentation

## 2020-01-23 DIAGNOSIS — I313 Pericardial effusion (noninflammatory): Secondary | ICD-10-CM | POA: Diagnosis not present

## 2020-01-23 DIAGNOSIS — S27329A Contusion of lung, unspecified, initial encounter: Secondary | ICD-10-CM | POA: Diagnosis not present

## 2020-01-23 DIAGNOSIS — S299XXA Unspecified injury of thorax, initial encounter: Secondary | ICD-10-CM | POA: Diagnosis not present

## 2020-01-23 DIAGNOSIS — S3991XA Unspecified injury of abdomen, initial encounter: Secondary | ICD-10-CM | POA: Diagnosis not present

## 2020-01-23 DIAGNOSIS — S2242XA Multiple fractures of ribs, left side, initial encounter for closed fracture: Secondary | ICD-10-CM | POA: Diagnosis not present

## 2020-01-23 DIAGNOSIS — J320 Chronic maxillary sinusitis: Secondary | ICD-10-CM | POA: Diagnosis not present

## 2020-01-23 DIAGNOSIS — N6320 Unspecified lump in the left breast, unspecified quadrant: Secondary | ICD-10-CM | POA: Diagnosis not present

## 2020-01-23 DIAGNOSIS — S199XXA Unspecified injury of neck, initial encounter: Secondary | ICD-10-CM | POA: Diagnosis not present

## 2020-01-23 LAB — COMPREHENSIVE METABOLIC PANEL
ALT: 20 U/L (ref 0–44)
AST: 34 U/L (ref 15–41)
Albumin: 3.9 g/dL (ref 3.5–5.0)
Alkaline Phosphatase: 40 U/L (ref 38–126)
Anion gap: 6 (ref 5–15)
BUN: 12 mg/dL (ref 6–20)
CO2: 30 mmol/L (ref 22–32)
Calcium: 9.1 mg/dL (ref 8.9–10.3)
Chloride: 102 mmol/L (ref 98–111)
Creatinine, Ser: 0.87 mg/dL (ref 0.44–1.00)
GFR, Estimated: >60 mL/min (ref >60)
Glucose, Bld: 93 mg/dL (ref 70–99)
Potassium: 3.9 mmol/L (ref 3.5–5.1)
Sodium: 138 mmol/L (ref 135–145)
Total Bilirubin: 0.8 mg/dL (ref 0.3–1.2)
Total Protein: 6.7 g/dL (ref 6.5–8.1)

## 2020-01-23 LAB — CBC WITH DIFFERENTIAL/PLATELET
Abs Immature Granulocytes: 0.05 10*3/uL (ref 0.00–0.07)
Basophils Absolute: 0 10*3/uL (ref 0.0–0.1)
Basophils Relative: 0 %
Eosinophils Absolute: 0.2 10*3/uL (ref 0.0–0.5)
Eosinophils Relative: 2 %
HCT: 39.9 % (ref 36.0–46.0)
Hemoglobin: 13 g/dL (ref 12.0–15.0)
Immature Granulocytes: 1 %
Lymphocytes Relative: 32 %
Lymphs Abs: 3.1 10*3/uL (ref 0.7–4.0)
MCH: 30.4 pg (ref 26.0–34.0)
MCHC: 32.6 g/dL (ref 30.0–36.0)
MCV: 93.4 fL (ref 80.0–100.0)
Monocytes Absolute: 0.5 10*3/uL (ref 0.1–1.0)
Monocytes Relative: 6 %
Neutro Abs: 5.7 10*3/uL (ref 1.7–7.7)
Neutrophils Relative %: 59 %
Platelets: 275 10*3/uL (ref 150–400)
RBC: 4.27 MIL/uL (ref 3.87–5.11)
RDW: 13.6 % (ref 11.5–15.5)
WBC: 9.5 10*3/uL (ref 4.0–10.5)
nRBC: 0 % (ref 0.0–0.2)

## 2020-01-23 LAB — HCG, QUANTITATIVE, PREGNANCY: hCG, Beta Chain, Quant, S: <1 m[IU]/mL (ref <5)

## 2020-01-23 MED ORDER — SODIUM CHLORIDE 0.9 % IV BOLUS
500.0000 mL | Freq: Once | INTRAVENOUS | Status: AC
  Administered 2020-01-23: 500 mL via INTRAVENOUS

## 2020-01-23 MED ORDER — OXYCODONE-ACETAMINOPHEN 5-325 MG PO TABS
1.0000 | ORAL_TABLET | Freq: Four times a day (QID) | ORAL | 0 refills | Status: DC | PRN
Start: 2020-01-23 — End: 2020-01-25

## 2020-01-23 MED ORDER — IOHEXOL 300 MG/ML  SOLN
100.0000 mL | Freq: Once | INTRAMUSCULAR | Status: AC | PRN
  Administered 2020-01-23: 100 mL via INTRAVENOUS

## 2020-01-23 MED ORDER — HYDROMORPHONE HCL 1 MG/ML IJ SOLN
1.0000 mg | Freq: Once | INTRAMUSCULAR | Status: AC
  Administered 2020-01-23: 1 mg via INTRAVENOUS
  Filled 2020-01-23: qty 1

## 2020-01-23 MED ORDER — OXYCODONE-ACETAMINOPHEN 5-325 MG PO TABS
1.0000 | ORAL_TABLET | Freq: Once | ORAL | Status: AC
  Administered 2020-01-23: 1 via ORAL
  Filled 2020-01-23: qty 1

## 2020-01-23 NOTE — Discharge Instructions (Signed)
You should be admitted to the hospital but since you are leaving AMA if you change your mind and want to get seen again I recommend going to [facility]
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
## 2020-01-25 ENCOUNTER — Telehealth (HOSPITAL_COMMUNITY): Payer: Self-pay

## 2020-01-25 ENCOUNTER — Telehealth (HOSPITAL_COMMUNITY): Payer: Self-pay | Admitting: Emergency Medicine

## 2020-01-25 MED ORDER — OXYCODONE-ACETAMINOPHEN 5-325 MG PO TABS
1.0000 | ORAL_TABLET | Freq: Four times a day (QID) | ORAL | 0 refills | Status: AC | PRN
Start: 2020-01-25 — End: ?

## 2020-01-25 MED ORDER — OXYCODONE-ACETAMINOPHEN 5-325 MG PO TABS: 1.0000 | ORAL_TABLET | Freq: Four times a day (QID) | ORAL | 0 refills | Status: AC | PRN
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
## 2020-01-25 NOTE — Telephone Encounter (Signed)
Sending in percocet prescription electronically.  Pt brought in unsigned paper rx

## 2020-01-25 NOTE — ED Notes (Signed)
[facility] spoke with pharmacist at [facility] who advised that they had received pt's prescription and had called the pt to have her come and pick it up,

## 2020-01-25 NOTE — Telephone Encounter (Signed)
Trying to re send rx

## 2020-01-27 ENCOUNTER — Telehealth: Payer: Self-pay | Admitting: *Deleted

## 2020-01-27 NOTE — Telephone Encounter (Signed)
Emailed request to [facility] to schedule  NP appointment .   Jennifer Ewing PEC [phone]                                                                                       

## 2020-01-28 ENCOUNTER — Emergency Department (HOSPITAL_COMMUNITY)
Admission: EM | Admit: 2020-01-28 | Discharge: 2020-01-28 | Discharge status: Against Medical Advice | Payer: No Typology Code available for payment source

## 2020-01-28 ENCOUNTER — Emergency Department (HOSPITAL_COMMUNITY): Payer: No Typology Code available for payment source

## 2020-01-28 DIAGNOSIS — D71 Functional disorders of polymorphonuclear neutrophils: Secondary | ICD-10-CM | POA: Diagnosis not present

## 2020-01-28 DIAGNOSIS — R52 Pain, unspecified: Secondary | ICD-10-CM | POA: Diagnosis not present

## 2020-01-28 DIAGNOSIS — J90 Pleural effusion, not elsewhere classified: Secondary | ICD-10-CM | POA: Diagnosis not present

## 2020-01-28 DIAGNOSIS — J8410 Pulmonary fibrosis, unspecified: Secondary | ICD-10-CM | POA: Diagnosis not present

## 2020-01-28 DIAGNOSIS — R0602 Shortness of breath: Secondary | ICD-10-CM | POA: Diagnosis not present

## 2020-01-28 DIAGNOSIS — R0781 Pleurodynia: Secondary | ICD-10-CM | POA: Diagnosis not present

## 2020-01-28 DIAGNOSIS — R0689 Other abnormalities of breathing: Secondary | ICD-10-CM | POA: Diagnosis not present

## 2020-01-28 DIAGNOSIS — S2242XA Multiple fractures of ribs, left side, initial encounter for closed fracture: Secondary | ICD-10-CM | POA: Diagnosis not present

## 2020-01-28 DIAGNOSIS — J984 Other disorders of lung: Secondary | ICD-10-CM | POA: Diagnosis not present

## 2020-01-31 ENCOUNTER — Other Ambulatory Visit: Payer: Self-pay | Admitting: Obstetrics and Gynecology

## 2020-01-31 NOTE — Patient Instructions (Signed)
Hi Ms.Peed,as a part of your Medicaid benefit, you are eligible for care management and care coordination services at no cost or copay. I was unable to reach you by phone today but would be happy to help you with your health related needs. Please feel free to call me at [phone].   A member of the Managed Medicaid care management team will reach out to you again over the next 7 days.   Kathi Der RN, BSN [place]  [facility] - Managed Medicaid High Risk [phone].

## 2020-01-31 NOTE — Patient Outreach (Signed)
Care Coordination  01/31/2020  Jennifer Ewing June 26, 1980 [id number]  An unsuccessful telephone outreach was attempted today. The patient was referred to the case management team for assistance with care management and care coordination.   Follow Up Plan: The Managed Medicaid care management team will reach out to the patient again over the next 7 days.   Kathi Der RN, BSN [place]  [employer] production - Managed Medicaid High Risk [phone]

## 2020-02-07 ENCOUNTER — Other Ambulatory Visit: Payer: Self-pay

## 2020-02-07 NOTE — Patient Instructions (Signed)
HI Jennifer Ewing, sorry we missed you today - as a part of your Medicaid benefit, you are eligible for care management and care coordination services at no cost or copay. I was unable to reach you by phone today but would be happy to help you with your health related needs. Please feel free to call me at [phone].  A member of the Managed Medicaid care management team will reach out to you again over the next 7 days.   Kathi Der RN, BSN [place]  [employer], production - Managed Medicaid High Risk [phone]

## 2020-02-07 NOTE — Patient Outreach (Signed)
Care Coordination  02/07/2020  Jennifer Ewing 05-20-1980 [id number]  A second unsuccessful telephone outreach was attempted today. The patient was referred to the case management team for assistance with care management and care coordination.   Follow Up Plan: The Managed Medicaid care management team will reach out to the patient again over the next 7 days.   Kathi Der RN, BSN [place]  [employer], production - Managed Medicaid High Risk [phone]

## 2020-02-17 ENCOUNTER — Other Ambulatory Visit: Payer: Self-pay | Admitting: Obstetrics and Gynecology

## 2020-02-17 NOTE — Patient Outreach (Signed)
Care Coordination  02/17/2020  Kiona R Boehning 01/10/1981 [id number]  Third unsuccessful telephone outreach was attempted today. The patient was referred to the case management team for assistance with care management and care coordination. The patient's primary care provider has been notified of our unsuccessful attempts to make or maintain contact with the patient. The care management team is pleased to engage with this patient at any time in the future should he/she be interested in assistance from the care management team.   Follow Up Plan: The patient has been provided with contact information for the Managed Medicaid care management team and has been advised to call with any health related questions or concerns.  The Managed Medicaid care management team is available to follow up with the patient after provider conversation with the patient regarding recommendation for care management engagement and subsequent re-referral to the care management team.   Linh Hedberg RN, BSN [place]  [facility] Care Management Coordinator - Managed Medicaid High Risk [phone] 

## 2020-02-17 NOTE — Patient Instructions (Signed)
Hi Ms. Brasil we missed you,  as a part of your Medicaid benefit, you are eligible for care management and care coordination services at no cost or copay. I was unable to reach you by phone today but would be happy to help you with your health related needs. Please feel free to call me at [phone].  Kathi Der RN, BSN [place]  [employer], production - Managed Medicaid High Risk [phone]

## 2020-02-17 NOTE — Patient Outreach (Deleted)
Care Coordination  02/17/2020  Shakeeta Godette Hubble 1980/05/11 [id number]  Third unsuccessful telephone outreach was attempted today. The patient was referred to the case management team for assistance with care management and care coordination. The patient's primary care provider has been notified of our unsuccessful attempts to make or maintain contact with the patient. The care management team is pleased to engage with this patient at any time in the future should he/she be interested in assistance from the care management team.   Follow Up Plan: The patient has been provided with contact information for the Managed Medicaid care management team and has been advised to call with any health related questions or concerns.  The Managed Medicaid care management team is available to follow up with the patient after provider conversation with the patient regarding recommendation for care management engagement and subsequent re-referral to the care management team.   Kathi Der RN, BSN [place]  [facility] Care Management Coordinator - Managed [place] High Risk [phone]

## 2020-02-25 DIAGNOSIS — F3181 Bipolar II disorder: Secondary | ICD-10-CM | POA: Diagnosis not present

## 2020-02-25 DIAGNOSIS — Z79891 Long term (current) use of opiate analgesic: Secondary | ICD-10-CM | POA: Diagnosis not present

## 2020-02-25 DIAGNOSIS — F902 Attention-deficit hyperactivity disorder, combined type: Secondary | ICD-10-CM | POA: Diagnosis not present

## 2020-05-29 ENCOUNTER — Telehealth: Payer: Self-pay

## 2020-05-29 NOTE — Telephone Encounter (Signed)
Transition Care Management Unsuccessful Follow-up Telephone Call  Date of discharge and from where:  05/28/2020 from [facility].   Attempts:  1st Attempt  Reason for unsuccessful TCM follow-up call:  Unable to reach patient   Number is listed as not a working number.

## 2020-06-29 ENCOUNTER — Telehealth: Payer: Self-pay

## 2020-06-29 NOTE — Telephone Encounter (Signed)
Transition Care Management Unsuccessful Follow-up Telephone Call  Date of discharge and from where:  06/24/2020 from [facility]  Attempts:  1st Attempt  Reason for unsuccessful TCM follow-up call:  Unable to leave message

## 2020-06-30 NOTE — Telephone Encounter (Signed)
Transition Care Management Unsuccessful Follow-up Telephone Call  Date of discharge and from where:  06/27/2020 [facility] ED  Attempts:  2nd Attempt  Reason for unsuccessful TCM follow-up call:  Left voice message

## 2020-07-01 NOTE — Telephone Encounter (Signed)
Transition Care Management Unsuccessful Follow-up Telephone Call  Date of discharge and from where:  06/27/2020 from [facility] ED  Attempts:  3rd Attempt  Reason for unsuccessful TCM follow-up call:  Unable to reach patient

## 2020-08-18 ENCOUNTER — Encounter: Payer: Medicaid Other | Admitting: Adult Health

## 2020-10-06 IMAGING — DX DG HAND COMPLETE 3+V*R*
3 series · 3 of 3 positions shown · non-contrast
Comparison: None.

CLINICAL DATA: 39-year-old female with fall and pain in the right
thumb.

EXAM:
RIGHT HAND - COMPLETE 3+ VIEW

[hand pa]
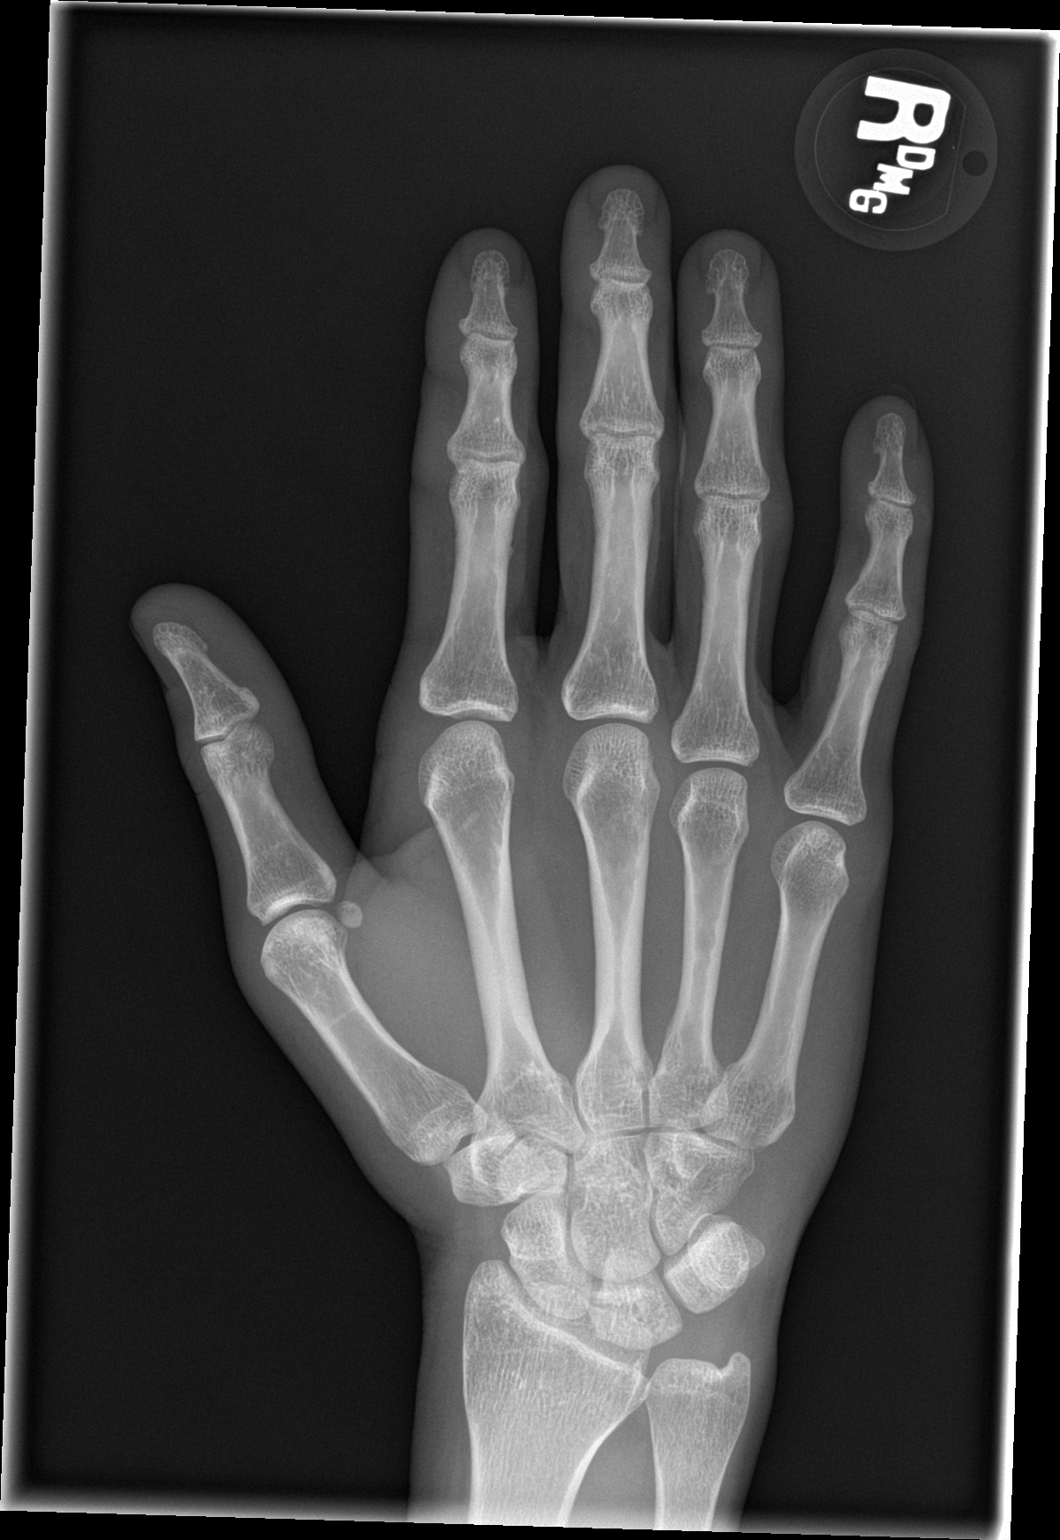

[hand obl]
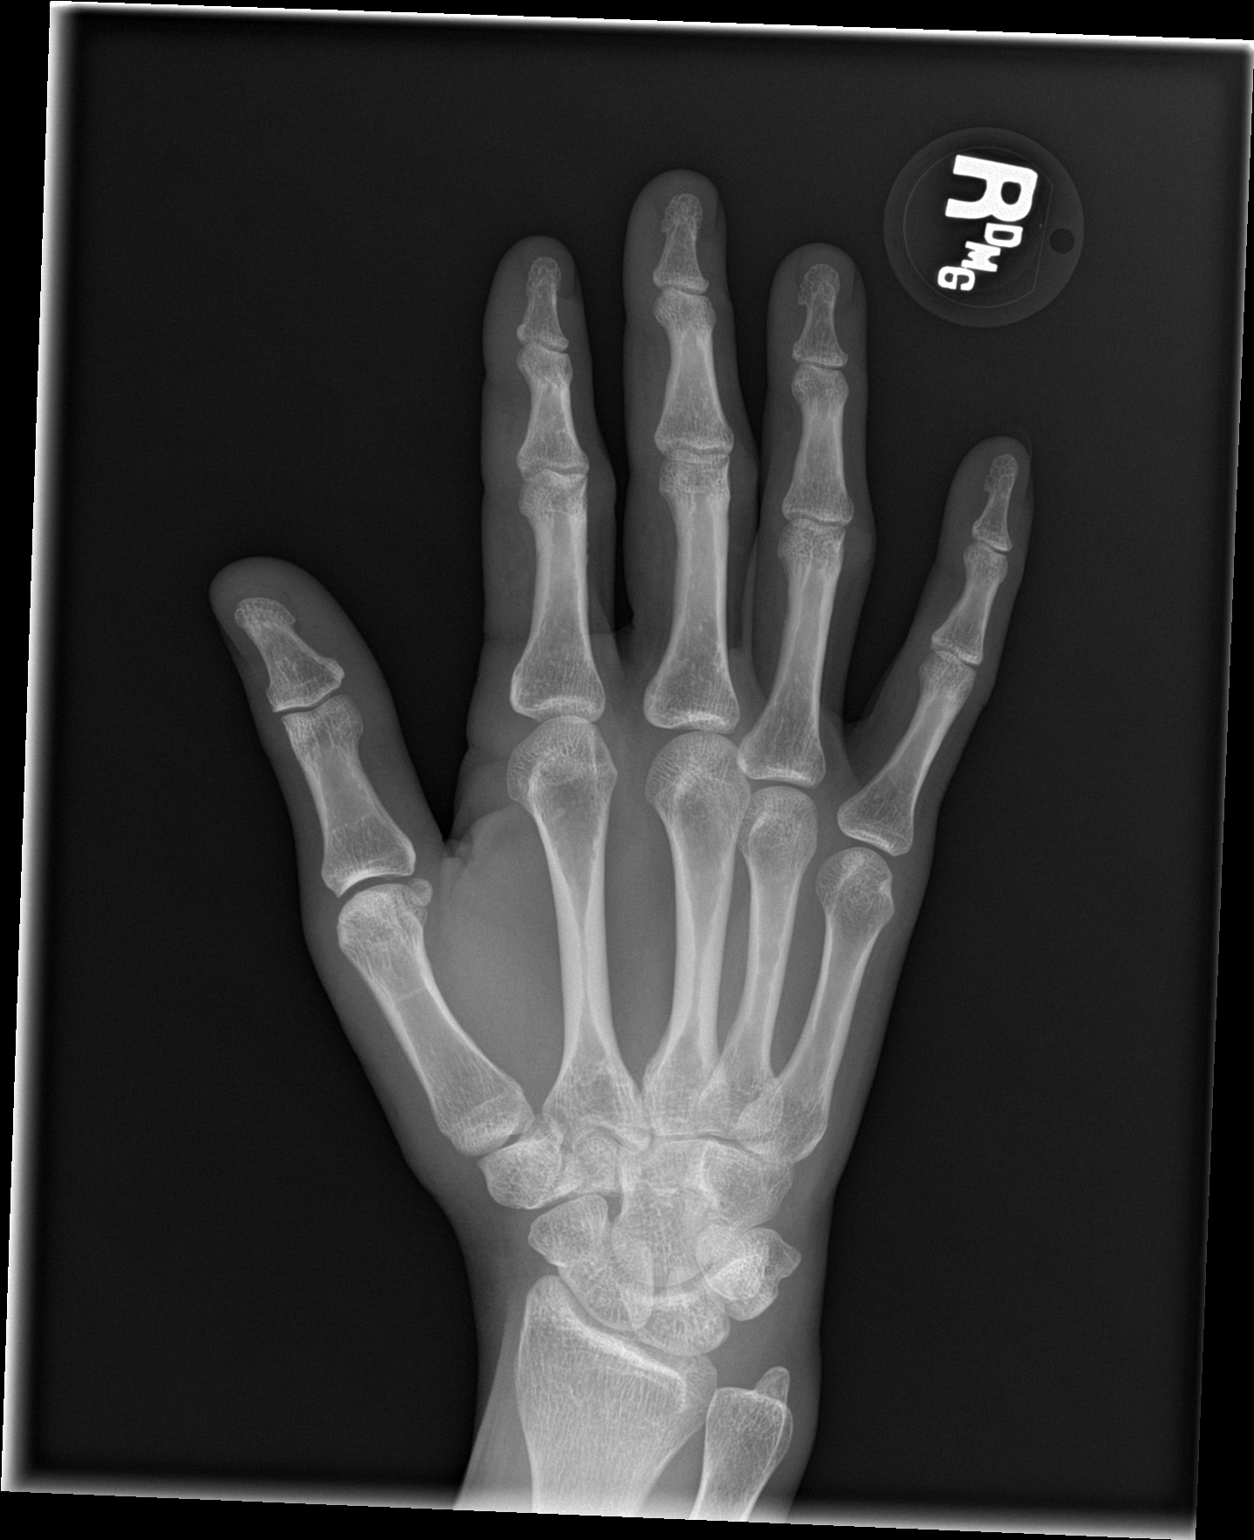

[hand lat]
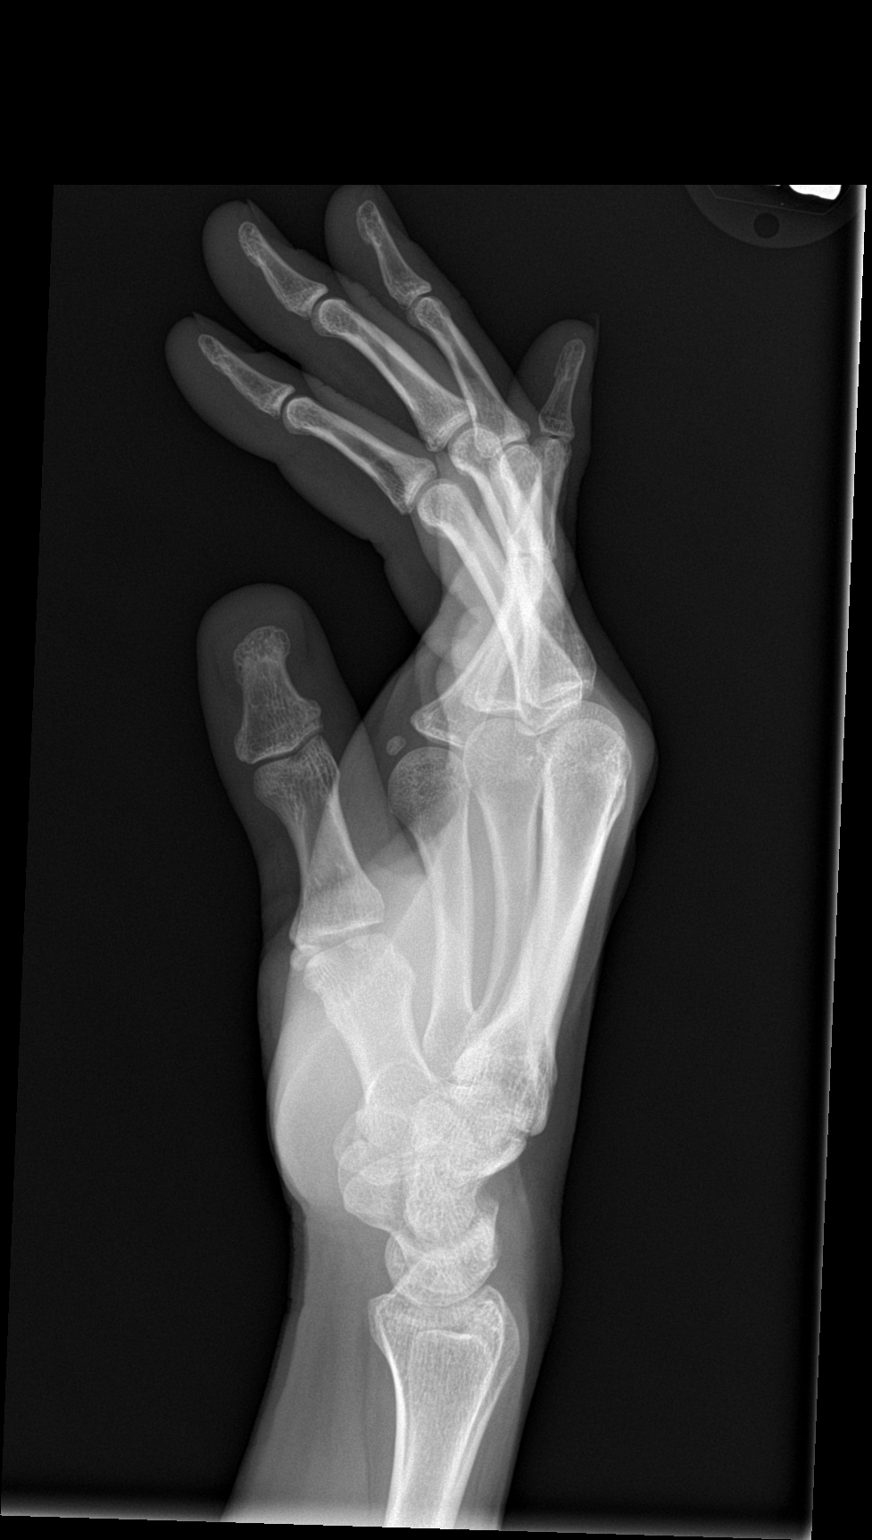

[3 of 3 positions shown; findings below may reference images not displayed]

FINDINGS: There is no evidence of fracture or dislocation. There is no
evidence of arthropathy or other focal bone abnormality. Soft
tissues are unremarkable.
IMPRESSION: Negative.
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
## 2021-08-21 ENCOUNTER — Emergency Department (HOSPITAL_COMMUNITY): Payer: BC Managed Care – PPO

## 2021-08-21 ENCOUNTER — Emergency Department (HOSPITAL_COMMUNITY)
Admission: EM | Admit: 2021-08-21 | Discharge: 2021-08-21 | Discharge status: Against Medical Advice | Payer: BC Managed Care – PPO | Attending: Emergency Medicine | Admitting: Emergency Medicine

## 2021-08-21 ENCOUNTER — Encounter (HOSPITAL_COMMUNITY): Payer: Self-pay

## 2021-08-21 ENCOUNTER — Other Ambulatory Visit: Payer: Self-pay

## 2021-08-21 DIAGNOSIS — R509 Fever, unspecified: Secondary | ICD-10-CM | POA: Insufficient documentation

## 2021-08-21 DIAGNOSIS — R059 Cough, unspecified: Secondary | ICD-10-CM | POA: Insufficient documentation

## 2021-08-21 DIAGNOSIS — R1030 Lower abdominal pain, unspecified: Secondary | ICD-10-CM | POA: Diagnosis not present

## 2021-08-21 DIAGNOSIS — R112 Nausea with vomiting, unspecified: Secondary | ICD-10-CM | POA: Diagnosis not present

## 2021-08-21 DIAGNOSIS — Z5329 Procedure and treatment not carried out because of patient's decision for other reasons: Secondary | ICD-10-CM | POA: Diagnosis not present

## 2021-08-21 DIAGNOSIS — R6889 Other general symptoms and signs: Secondary | ICD-10-CM

## 2021-08-21 DIAGNOSIS — Z20822 Contact with and (suspected) exposure to covid-19: Secondary | ICD-10-CM | POA: Insufficient documentation

## 2021-08-21 LAB — RESP PANEL BY RT-PCR (FLU A&B, COVID) ARPGX2
Influenza A by PCR: NEGATIVE
Influenza B by PCR: NEGATIVE
SARS Coronavirus 2 by RT PCR: NEGATIVE

## 2021-08-21 MED ORDER — ONDANSETRON 4 MG PO TBDP
4.0000 mg | ORAL_TABLET | Freq: Once | ORAL | Status: DC
  Filled 2021-08-21: qty 1
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
## 2021-08-21 NOTE — ED Triage Notes (Signed)
Pov from home. Cc of fever and chills for 3 days. Co-workers been sick . Dont know whats wrong with them. No fever in triage.  ?

## 2021-08-21 NOTE — ED Provider Notes (Signed)
?[facility] ?Provider Note ? ? ?CSN: [id number] ?Arrival date & time: 08/21/21  1943 ? ?  ? ?History ? ?Chief Complaint  ?Patient presents with  ? Fever  ? ? ?Jennifer Ewing is a 41 y.o. female with no significant past medical history presenting for evaluation of flulike symptoms which started 3 days ago.  She describes intermittent chills and fevers with a Tmax of 104 while at work today.  She has had no antipyretics prior to arrival.  She also describes a nonproductive cough but denies shortness of breath, also no chest pain, denies sore throat, she has had nausea with emesis, 1 episode of vomiting yesterday to today, denies diarrhea, she does have a low abdominal cramping with the nausea episodes.  She has had no medication for her symptoms prior to arrival.  She does state that multiple coworkers have been out ill with similar symptoms.  She also endorses that she has had recent tick bites to her abdomen, reports she had been working in her yard and then 6 days ago discovered at least 3 embedded ticks on her abdomen.  She denies rash. ? ?The history is provided by the patient.  ? ?  ? ?Home Medications ?Prior to Admission medications   ?Medication Sig Start Date End Date Taking? Authorizing Provider  ?ALPRAZolam (XANAX) 0.5 MG tablet Take 0.5 mg by mouth in the morning, at noon, in the evening, and at bedtime.     [provider]  ?amphetamine-dextroamphetamine (ADDERALL) 30 MG tablet Take 30 mg by mouth 2 (two) times daily.  06/13/19   [provider]  ?gabapentin (NEURONTIN) 600 MG tablet Take 600 mg by mouth 2 (two) times daily. 05/23/19   [provider]  ?HYDROcodone-acetaminophen (NORCO/VICODIN) 5-325 MG tablet Take one tab po q 4 hrs prn pain ?Patient taking differently: Take 1 tablet by mouth every 4 (four) hours as needed for moderate pain.  06/30/19   Triplett, Tammy, PA-C  ?lamoTRIgine (LAMICTAL) 200 MG tablet Take 200 mg by mouth every morning. 05/23/19    [provider]  ?oxyCODONE-acetaminophen (PERCOCET) 5-325 MG tablet Take 1 tablet by mouth every 6 (six) hours as needed. 01/25/20   Linwood Dibbles, MD  ?oxyCODONE-acetaminophen (PERCOCET/ROXICET) 5-325 MG tablet Take 1 tablet by mouth every 6 (six) hours as needed for severe pain. 01/25/20   Linwood Dibbles, MD  ?perphenazine (TRILAFON) 2 MG tablet Take 2 mg by mouth 2 (two) times daily. 10/05/19   [provider]  ?phenylephrine (SUDAFED PE) 10 MG TABS tablet Take 10 mg by mouth every 4 (four) hours as needed (for cough/congestion).    [provider]  ?   ? ?Allergies    ?Codeine   ? ?Review of Systems   ?Review of Systems  ?Constitutional:  Positive for fatigue and fever.  ?HENT:  Negative for congestion and sore throat.   ?Eyes: Negative.   ?Respiratory:  Positive for cough. Negative for chest tightness and shortness of breath.   ?Cardiovascular:  Negative for chest pain.  ?Gastrointestinal:  Positive for abdominal pain, nausea and vomiting. Negative for diarrhea.  ?Genitourinary: Negative.  Negative for dysuria.  ?Musculoskeletal:  Positive for myalgias. Negative for arthralgias, joint swelling and neck pain.  ?Skin: Negative.  Negative for rash and wound.  ?Neurological:  Negative for dizziness, weakness, light-headedness, numbness and headaches.  ?Psychiatric/Behavioral: Negative.    ? ?Physical Exam ?Updated Vital Signs ?BP (!) 114/91 (BP Location: Right Arm)   Pulse 77   Temp 98.3 ?  F (36.8 ?C) (Oral)   Resp 19   Ht 5\' 2"  (1.575 m)   Wt 52.2 kg   LMP 08/21/2021 (Exact Date)   SpO2 98%   BMI 21.03 kg/m?  ?Physical Exam ?Vitals and nursing note reviewed.  ?Constitutional:   ?   Appearance: She is well-developed.  ?HENT:  ?   Head: Normocephalic and atraumatic.  ?   Mouth/Throat:  ?   Mouth: Mucous membranes are moist.  ?   Pharynx: No oropharyngeal exudate or posterior oropharyngeal erythema.  ?Eyes:  ?   Conjunctiva/sclera: Conjunctivae normal.  ?Cardiovascular:  ?   Rate and  Rhythm: Normal rate and regular rhythm.  ?   Heart sounds: Normal heart sounds.  ?Pulmonary:  ?   Effort: Pulmonary effort is normal.  ?   Breath sounds: Normal breath sounds. No wheezing.  ?Abdominal:  ?   General: Bowel sounds are normal. There is no distension.  ?   Palpations: Abdomen is soft.  ?   Tenderness: There is no abdominal tenderness. There is no guarding or rebound.  ?   Comments: Mild suprapubic pain with palpation, no guarding.  ?Musculoskeletal:     ?   General: Normal range of motion.  ?   Cervical back: Normal range of motion.  ?Lymphadenopathy:  ?   Cervical: No cervical adenopathy.  ?Skin: ?   General: Skin is warm and dry.  ?   Findings: No rash.  ?   Comments: Patient has 3 small erythematous bite sites on her abdomen, no surrounding erythema, no halos.  ?Neurological:  ?   Mental Status: She is alert.  ? ? ?ED Results / Procedures / Treatments   ?Labs ?(all labs ordered are listed, but only abnormal results are displayed) ?Labs Reviewed  ?RESP PANEL BY RT-PCR (FLU A&B, COVID) ARPGX2  ? ? ?EKG ?None ? ?Radiology ?No results found. ? ?Procedures ?Procedures  ? ? ?Medications Ordered in ED ?Medications  ?ondansetron (ZOFRAN-ODT) disintegrating tablet 4 mg (4 mg Oral Not Given 08/21/21 2119)  ? ? ?ED Course/ Medical Decision Making/ A&P ?  ?                        ?Medical Decision Making ?Patient presented with flulike symptoms of unclear etiology, although she endorses positive exposures to coworkers with similar symptoms over the past week.  Her history however is also complicated by recent tick bites to her abdomen.  Lab work has been ordered, chest x-ray also ordered given her complaint of cough with fever.  After my initial evaluation, patient became angry with the wait time and refused any lab test collection or chest x-ray.  She left the department after having a conversation with the RN, I was not aware that she had left until she had already left the building. ? ?Amount and/or Complexity  of Data Reviewed ?Labs: ordered. ?Radiology: ordered. ? ?Risk ?Prescription drug management. ? ? ? ? ? ? ? ? ? ? ?Final Clinical Impression(s) / ED Diagnoses ?Final diagnoses:  ?Flu-like symptoms  ? ? ?Rx / DC Orders ?ED Discharge Orders   ? ? None  ? ?  ? ? ?  ?2120, PA-C ?08/21/21 2254 ? ?  ?2255, MD ?08/22/21 0957 ? ?

## 2021-08-21 NOTE — ED Notes (Signed)
Pt refused chest x-ray

## 2022-10-24 ENCOUNTER — Emergency Department (HOSPITAL_COMMUNITY): Payer: BC Managed Care – PPO

## 2022-10-24 ENCOUNTER — Emergency Department (HOSPITAL_COMMUNITY)
Admission: EM | Admit: 2022-10-24 | Discharge: 2022-10-24 | Disposition: A | Discharge status: Home / Self Care | Payer: BC Managed Care – PPO | Source: Home / Self Care | Attending: Emergency Medicine | Admitting: Emergency Medicine

## 2022-10-24 ENCOUNTER — Encounter (HOSPITAL_COMMUNITY): Payer: Self-pay

## 2022-10-24 ENCOUNTER — Other Ambulatory Visit: Payer: Self-pay

## 2022-10-24 DIAGNOSIS — W311XXA Contact with metalworking machines, initial encounter: Secondary | ICD-10-CM | POA: Diagnosis not present

## 2022-10-24 DIAGNOSIS — S62627A Displaced fracture of medial phalanx of left little finger, initial encounter for closed fracture: Secondary | ICD-10-CM

## 2022-10-24 DIAGNOSIS — M79645 Pain in left finger(s): Secondary | ICD-10-CM | POA: Diagnosis present

## 2022-10-24 DIAGNOSIS — Y93A6 Activity, grass drills: Secondary | ICD-10-CM | POA: Diagnosis not present

## 2022-10-24 MED ORDER — HYDROCODONE-ACETAMINOPHEN 5-325 MG PO TABS
1.0000 | ORAL_TABLET | Freq: Once | ORAL | Status: AC
  Administered 2022-10-24: 1 via ORAL
  Filled 2022-10-24: qty 1

## 2022-10-24 NOTE — Discharge Instructions (Signed)
Schedule follow-up with Dr. Romeo Apple in the office for a recheck.  Wear the splint continuously until you are seen.

## 2022-10-24 NOTE — ED Provider Notes (Signed)
[place] EMERGENCY DEPARTMENT AT [facility] Provider Note   CSN: [id number] Arrival date & time: 10/24/22  2212     History  No chief complaint on file.   Jennifer Ewing is a 42 y.o. female.  Presents to the emergency department for evaluation of finger injury.  Patient reports that she injured her finger 3 weeks ago while working with a drill.  She did not get seen, has been wearing a finger splint.  She hit it again tonight and now it hurts worse.       Home Medications Prior to Admission medications   Medication Sig Start Date End Date Taking? Authorizing Provider  ALPRAZolam Prudy Feeler) 0.5 MG tablet Take 0.5 mg by mouth in the morning, at noon, in the evening, and at bedtime.     [provider]  amphetamine-dextroamphetamine (ADDERALL) 30 MG tablet Take 30 mg by mouth 2 (two) times daily.  06/13/19   [provider]  gabapentin (NEURONTIN) 600 MG tablet Take 600 mg by mouth 2 (two) times daily. 05/23/19   [provider]  HYDROcodone-acetaminophen (NORCO/VICODIN) 5-325 MG tablet Take one tab po q 4 hrs prn pain Patient taking differently: Take 1 tablet by mouth every 4 (four) hours as needed for moderate pain.  06/30/19   Triplett, Tammy, PA-C  lamoTRIgine (LAMICTAL) 200 MG tablet Take 200 mg by mouth every morning. 05/23/19   [provider]  oxyCODONE-acetaminophen (PERCOCET) 5-325 MG tablet Take 1 tablet by mouth every 6 (six) hours as needed. 01/25/20   Linwood Dibbles, MD  oxyCODONE-acetaminophen (PERCOCET/ROXICET) 5-325 MG tablet Take 1 tablet by mouth every 6 (six) hours as needed for severe pain. 01/25/20   Linwood Dibbles, MD  perphenazine (TRILAFON) 2 MG tablet Take 2 mg by mouth 2 (two) times daily. 10/05/19   [provider]  phenylephrine (SUDAFED PE) 10 MG TABS tablet Take 10 mg by mouth every 4 (four) hours as needed (for cough/congestion).    [provider]      Allergies    Codeine    Review of Systems    Review of Systems  Physical Exam Updated Vital Signs BP 122/86 (BP Location: Right Arm)   Pulse 98   Temp 98.9 F (37.2 C) (Oral)   Resp 14   SpO2 98%  Physical Exam Constitutional:      Appearance: Normal appearance.  HENT:     Head: Atraumatic.  Musculoskeletal:     Left hand: Deformity (Fifth digit) and tenderness (Fifth digit) present. No lacerations. Decreased range of motion (Fifth digit). There is no disruption of two-point discrimination. Normal capillary refill. Normal pulse.  Skin:    Findings: No abrasion, bruising or laceration.  Neurological:     Mental Status: She is alert.     Sensory: Sensation is intact.     Motor: Motor function is intact.     ED Results / Procedures / Treatments   Labs (all labs ordered are listed, but only abnormal results are displayed) Labs Reviewed - No data to display  EKG None  Radiology DG Finger Little Left  Result Date: 10/24/2022 CLINICAL DATA:  Finger injury EXAM: LEFT FINGER(S) - 2+ VIEW COMPARISON:  None Available. FINDINGS: 2 mm displaced fracture fragment off the volar base of the middle phalanx, fracture fragment is seen anterior to head of proximal phalanx. No subluxation. Positive for soft tissue swelling IMPRESSION: Small displaced intra-articular fracture fragment off the volar base of the middle phalanx. Electronically Signed   By:  Jasmine Pang M.D.   On: 10/24/2022 22:50    Procedures Procedures    Medications Ordered in ED Medications  HYDROcodone-acetaminophen (NORCO/VICODIN) 5-325 MG per tablet 1 tablet (has no administration in time range)    ED Course/ Medical Decision Making/ A&P                             Medical Decision Making Amount and/or Complexity of Data Reviewed Radiology: ordered.   Presents with injury to fifth digit on left finger.  Patient with slight deformity at the middle interphalangeal joint.  X-ray shows small fragment off the volar aspect of the middle phalanx.  Placed  back in splint and follow-up with orthopedics.        Final Clinical Impression(s) / ED Diagnoses Final diagnoses:  Closed displaced fracture of middle phalanx of left little finger, initial encounter    Rx / DC Orders ED Discharge Orders     None         Gilda Crease, MD 10/24/22 2316

## 2022-10-24 NOTE — ED Triage Notes (Signed)
Pt c/o finger injury x3 weeks ago after getting it caught in a drill. Pt did not get seen and put a splint on it. Pt hit it at work tonight and states now that it hurts even worse.
